# Patient Record
Sex: Male | Born: 1975 | Race: Black or African American | Hispanic: No | Marital: Married | Smoking: Heavy tobacco smoker
Health system: Southern US, Community
[De-identification: ages and names within clinical notes are randomized; demographics above are authoritative.]

## PROBLEM LIST (undated history)

## (undated) DIAGNOSIS — E119 Type 2 diabetes mellitus without complications: Secondary | ICD-10-CM

## (undated) DIAGNOSIS — J449 Chronic obstructive pulmonary disease, unspecified: Secondary | ICD-10-CM

## (undated) DIAGNOSIS — I10 Essential (primary) hypertension: Secondary | ICD-10-CM

## (undated) DIAGNOSIS — M4712 Other spondylosis with myelopathy, cervical region: Secondary | ICD-10-CM

## (undated) DIAGNOSIS — G4730 Sleep apnea, unspecified: Secondary | ICD-10-CM

## (undated) HISTORY — DX: Essential (primary) hypertension: I10

## (undated) HISTORY — PX: OTHER SURGICAL HISTORY: SHX169

## (undated) HISTORY — PX: KNEE SURGERY: SHX244

---

## 2004-12-08 ENCOUNTER — Emergency Department: Payer: Self-pay | Admitting: Emergency Medicine

## 2008-04-29 ENCOUNTER — Emergency Department: Payer: Self-pay | Admitting: Emergency Medicine

## 2008-12-04 ENCOUNTER — Emergency Department: Payer: Self-pay | Admitting: Emergency Medicine

## 2008-12-17 ENCOUNTER — Ambulatory Visit: Payer: Self-pay | Admitting: Internal Medicine
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
## 2014-08-10 ENCOUNTER — Encounter: Payer: Self-pay | Admitting: Internal Medicine

## 2014-08-10 ENCOUNTER — Ambulatory Visit: Payer: Self-pay | Attending: Internal Medicine | Admitting: Internal Medicine

## 2014-08-10 VITALS — BP 146/99 | HR 101 | Temp 98.0°F | Resp 16 | Ht 65.5 in | Wt 229.0 lb

## 2014-08-10 DIAGNOSIS — Z2821 Immunization not carried out because of patient refusal: Secondary | ICD-10-CM | POA: Insufficient documentation

## 2014-08-10 DIAGNOSIS — F17200 Nicotine dependence, unspecified, uncomplicated: Secondary | ICD-10-CM

## 2014-08-10 DIAGNOSIS — I10 Essential (primary) hypertension: Secondary | ICD-10-CM | POA: Insufficient documentation

## 2014-08-10 DIAGNOSIS — Z720 Tobacco use: Secondary | ICD-10-CM

## 2014-08-10 DIAGNOSIS — J45901 Unspecified asthma with (acute) exacerbation: Secondary | ICD-10-CM | POA: Insufficient documentation

## 2014-08-10 DIAGNOSIS — J4521 Mild intermittent asthma with (acute) exacerbation: Secondary | ICD-10-CM

## 2014-08-10 DIAGNOSIS — F17210 Nicotine dependence, cigarettes, uncomplicated: Secondary | ICD-10-CM | POA: Insufficient documentation

## 2014-08-10 MED ORDER — ALBUTEROL SULFATE (2.5 MG/3ML) 0.083% IN NEBU: 2.5000 mg | INHALATION_SOLUTION | Freq: Four times a day (QID) | RESPIRATORY_TRACT | Status: DC | PRN

## 2014-08-10 MED ORDER — LISINOPRIL 20 MG PO TABS: 20.0000 mg | ORAL_TABLET | Freq: Every day | ORAL | Status: DC

## 2014-08-10 MED ORDER — ALBUTEROL SULFATE HFA 108 (90 BASE) MCG/ACT IN AERS: 2.0000 | INHALATION_SPRAY | Freq: Four times a day (QID) | RESPIRATORY_TRACT | Status: DC | PRN

## 2014-08-10 NOTE — Patient Instructions (Signed)
Smoking Cessation Quitting smoking is important to your health and has many advantages. However, it is not always easy to quit since nicotine is a very addictive drug. Oftentimes, people try 3 times or more before being able to quit. This document explains the best ways for you to prepare to quit smoking. Quitting takes hard work and a lot of effort, but you can do it. ADVANTAGES OF QUITTING SMOKING  You will live longer, feel better, and live better.  Your body will feel the impact of quitting smoking almost immediately.  Within 20 minutes, blood pressure decreases. Your pulse returns to its normal level.  After 8 hours, carbon monoxide levels in the blood return to normal. Your oxygen level increases.  After 24 hours, the chance of having a heart attack starts to decrease. Your breath, hair, and body stop smelling like smoke.  After 48 hours, damaged nerve endings begin to recover. Your sense of taste and smell improve.  After 72 hours, the body is virtually free of nicotine. Your bronchial tubes relax and breathing becomes easier.  After 2 to 12 weeks, lungs can hold more air. Exercise becomes easier and circulation improves.  The risk of having a heart attack, stroke, cancer, or lung disease is greatly reduced.  After 1 year, the risk of coronary heart disease is cut in half.  After 5 years, the risk of stroke falls to the same as a nonsmoker.  After 10 years, the risk of lung cancer is cut in half and the risk of other cancers decreases significantly.  After 15 years, the risk of coronary heart disease drops, usually to the level of a nonsmoker.  If you are pregnant, quitting smoking will improve your chances of having a healthy baby.  The people you live with, especially any children, will be healthier.  You will have extra money to spend on things other than cigarettes. QUESTIONS TO THINK ABOUT BEFORE ATTEMPTING TO QUIT You may want to talk about your answers with your  health care provider.  Why do you want to quit?  If you tried to quit in the past, what helped and what did not?  What will be the most difficult situations for you after you quit? How will you plan to handle them?  Who can help you through the tough times? Your family? Friends? A health care provider?  What pleasures do you get from smoking? What ways can you still get pleasure if you quit? Here are some questions to ask your health care provider:  How can you help me to be successful at quitting?  What medicine do you think would be best for me and how should I take it?  What should I do if I need more help?  What is smoking withdrawal like? How can I get information on withdrawal? GET READY  Set a quit date.  Change your environment by getting rid of all cigarettes, ashtrays, matches, and lighters in your home, car, or work. Do not let people smoke in your home.  Review your past attempts to quit. Think about what worked and what did not. GET SUPPORT AND ENCOURAGEMENT You have a better chance of being successful if you have help. You can get support in many ways.  Tell your family, friends, and coworkers that you are going to quit and need their support. Ask them not to smoke around you.  Get individual, group, or telephone counseling and support. Programs are available at local hospitals and health centers. Call   your local health department for information about programs in your area.  Spiritual beliefs and practices may help some smokers quit.  Download a "quit meter" on your computer to keep track of quit statistics, such as how long you have gone without smoking, cigarettes not smoked, and money saved.  Get a self-help book about quitting smoking and staying off tobacco. LEARN NEW SKILLS AND BEHAVIORS  Distract yourself from urges to smoke. Talk to someone, go for a walk, or occupy your time with a task.  Change your normal routine. Take a different route to work.  Drink tea instead of coffee. Eat breakfast in a different place.  Reduce your stress. Take a hot bath, exercise, or read a book.  Plan something enjoyable to do every day. Reward yourself for not smoking.  Explore interactive web-based programs that specialize in helping you quit. GET MEDICINE AND USE IT CORRECTLY Medicines can help you stop smoking and decrease the urge to smoke. Combining medicine with the above behavioral methods and support can greatly increase your chances of successfully quitting smoking.  Nicotine replacement therapy helps deliver nicotine to your body without the negative effects and risks of smoking. Nicotine replacement therapy includes nicotine gum, lozenges, inhalers, nasal sprays, and skin patches. Some may be available over-the-counter and others require a prescription.  Antidepressant medicine helps people abstain from smoking, but how this works is unknown. This medicine is available by prescription.  Nicotinic receptor partial agonist medicine simulates the effect of nicotine in your brain. This medicine is available by prescription. Ask your health care provider for advice about which medicines to use and how to use them based on your health history. Your health care provider will tell you what side effects to look out for if you choose to be on a medicine or therapy. Carefully read the information on the package. Do not use any other product containing nicotine while using a nicotine replacement product.  RELAPSE OR DIFFICULT SITUATIONS Most relapses occur within the first 3 months after quitting. Do not be discouraged if you start smoking again. Remember, most people try several times before finally quitting. You may have symptoms of withdrawal because your body is used to nicotine. You may crave cigarettes, be irritable, feel very hungry, cough often, get headaches, or have difficulty concentrating. The withdrawal symptoms are only temporary. They are strongest  when you first quit, but they will go away within 10-14 days. To reduce the chances of relapse, try to:  Avoid drinking alcohol. Drinking lowers your chances of successfully quitting.  Reduce the amount of caffeine you consume. Once you quit smoking, the amount of caffeine in your body increases and can give you symptoms, such as a rapid heartbeat, sweating, and anxiety.  Avoid smokers because they can make you want to smoke.  Do not let weight gain distract you. Many smokers will gain weight when they quit, usually less than 10 pounds. Eat a healthy diet and stay active. You can always lose the weight gained after you quit.  Find ways to improve your mood other than smoking. FOR MORE INFORMATION  www.smokefree.gov  Document Released: 08/18/2001 Document Revised: 01/08/2014 Document Reviewed: 12/03/2011 [employer] Patient Information 2015 [employer]. This information is not intended to replace advice given to you by your health care provider. Make sure you discuss any questions you have with your health care provider.  

## 2014-08-10 NOTE — Progress Notes (Signed)
Patient ID: Keith Liu, male   DOB: 25-May-1976, 38 y.o.   MRN: [id number]  NWG:[id number]  [id number]  DOB - 25-May-1976  CC:  Chief Complaint  Patient presents with  . Establish Care       HPI: Keith Liu is a 38 y.o. male here today to establish medical care.  He has a past medical history of hypertension, smoking, and asthma.  He reports that he has been out of all his medications for one week.  He was on Lisinopril 20 mg and some other medication that he does not remember the name of.  He knows that the second pill was a a diuretic. He was recently released from prison 20 days ago and has noticed a 10 pound weight gain since then.    Has had asthma since childhood.  He reports that he only has flairs when he gets a cold. He states that he has difficulty breathing in his sleep and has severe snoring.  He believes he had a sleep study in the past in [place] but thinks that he used a fake name to get it. He has not used a albuterol inhaler in a very long time.  He does have a nebulizer machine.     Patient has No headache, No chest pain, No abdominal pain - No Nausea, No new weakness tingling or numbness, No Cough - SOB.  Allergies not on file Past Medical History  Diagnosis Date  . Hypertension    No current outpatient prescriptions on file prior to visit.   No current facility-administered medications on file prior to visit.   Family History  Problem Relation Age of Onset  . COPD Mother   . Diabetes Mother   . Hypertension Mother    History   Social History  . Marital Status: Single    Spouse Name: N/A    Number of Children: N/A  . Years of Education: N/A   Occupational History  . Not on file.   Social History Main Topics  . Smoking status: Heavy Tobacco Smoker  . Smokeless tobacco: Not on file  . Alcohol Use: No  . Drug Use: No  . Sexual Activity: Yes   Other Topics Concern  . Not on file   Social History Narrative  . No narrative on file     Review of Systems: Constitutional: Negative for fever, chills, diaphoresis, activity change, appetite change and fatigue. HENT: Negative for ear pain, nosebleeds, congestion, facial swelling, rhinorrhea, neck pain, neck stiffness and ear discharge.  Eyes: Negative for pain, discharge, redness, itching and visual disturbance. Respiratory: Negative for cough, choking, chest tightness, shortness of breath, wheezing and stridor.  Cardiovascular: Negative for chest pain, palpitations and leg swelling. Gastrointestinal: Negative for abdominal distention. Genitourinary: Negative for dysuria, urgency, frequency, hematuria, flank pain, decreased urine volume, difficulty urinating and dyspareunia.  Musculoskeletal: Negative for back pain, joint swelling, arthralgia and gait problem. Neurological: Negative for dizziness, tremors, seizures, syncope, facial asymmetry, speech difficulty, weakness, light-headedness, numbness and headaches.  Hematological: Negative for adenopathy. Does not bruise/bleed easily. Psychiatric/Behavioral: Negative for hallucinations, behavioral problems, confusion, dysphoric mood, decreased concentration and agitation.    Objective:   Filed Vitals:   08/10/14 1627  BP: 146/99  Pulse: 101  Temp: 98 F (36.7 C)  Resp: 16    Physical Exam: Constitutional: Patient appears well-developed and well-nourished. No distress. HENT: Normocephalic, atraumatic, External right and left ear normal. Oropharynx is clear and moist.  Eyes: Conjunctivae and EOM are normal. PERRLA, no  scleral icterus. Neck: Normal ROM. Neck supple. No JVD. No tracheal deviation. No thyromegaly. CVS: RRR, S1/S2 +, no murmurs, no gallops, no carotid bruit.  Pulmonary: Effort and breath sounds normal, no stridor, rhonchi, wheezes, rales.  Abdominal: Soft. BS +, no distension, tenderness, rebound or guarding.  Musculoskeletal: Normal range of motion. No edema and no tenderness.  Lymphadenopathy: No  lymphadenopathy noted, cervical Neuro: Alert. Normal reflexes, muscle tone coordination. No cranial nerve deficit. Skin: Skin is warm and dry. No rash noted. Not diaphoretic. No erythema. No pallor. Psychiatric: Normal mood and affect. Behavior, judgment, thought content normal.  No results found for: WBC, HGB, HCT, MCV, PLT No results found for: CREATININE, BUN, NA, K, CL, CO2  No results found for: HGBA1C Lipid Panel  No results found for: CHOL, TRIG, HDL, CHOLHDL, VLDL, LDLCALC     Assessment and plan:   Keith Liu was seen today for establish care.  Diagnoses and associated orders for this visit:  Essential hypertension - Begin lisinopril (PRINIVIL,ZESTRIL) 20 MG tablet; Take 1 tablet (20 mg total) by mouth daily. - Lipid panel; Future - CBC; Future - COMPLETE METABOLIC PANEL WITH GFR; Future - Hemoglobin A1C; Future Patient blood pressure remains elevated today, will increase BP medication and have patient to return in 2 weeks for blood pressure recheck with nurse. Stressed diet changes, regular exercise regimen, and modifiable risk factors. Will follow up with CMP as needed, Will follow up with patient in 3-6 months.   Asthma with exacerbation, mild intermittent - albuterol (PROVENTIL HFA;VENTOLIN HFA) 108 (90 BASE) MCG/ACT inhaler; Inhale 2 puffs into the lungs every 6 (six) hours as needed for wheezing or shortness of breath. - albuterol (PROVENTIL) (2.5 MG/3ML) 0.083% nebulizer solution; Take 3 mLs (2.5 mg total) by nebulization every 6 (six) hours as needed for wheezing or shortness of breath. Smoking cessation discussed  Smoking Smoking cessation discussed for 3 minutes, patient is not willing to quit at this time. Will continue to assess on each visit. Discussed increased risk for diseases such as cancer, heart disease, and stroke.   Refused influenza vaccine Explained that annual influenza is recommended per CDC guidelines and is highly suggested to anyone who has has  CHF, COPD, DM or immunocompromised. Benefits of influenza described in detail.   Return in about 2 weeks (around 08/24/2014) for Nurse Visit-BP check and Lab and 3 mo PCP.Marland Kitchen.  The patient was given clear instructions to go to ER or return to medical center if symptoms don't improve, worsen or new problems develop. The patient verbalized understanding. The patient was told to call to get lab results if they haven't heard anything in the next week.     Holland CommonsKECK, Dauntae Derusha, NP-C [facility] [phone] 08/10/2014, 4:42 PM

## 2014-08-10 NOTE — Progress Notes (Signed)
Pt is here to establish care. Pt reports that he has SOB, asthma and a history of HTN. Pt has been out of medications for a week. Pt has a headache right now.

## 2014-08-22 ENCOUNTER — Encounter (HOSPITAL_COMMUNITY): Payer: Self-pay | Admitting: Family Medicine

## 2014-08-22 ENCOUNTER — Emergency Department (INDEPENDENT_AMBULATORY_CARE_PROVIDER_SITE_OTHER)
Admission: EM | Admit: 2014-08-22 | Discharge: 2014-08-22 | Disposition: A | Discharge status: Nursing Home (Includes ICF) | Payer: Self-pay | Source: Home / Self Care | Attending: Emergency Medicine | Admitting: Emergency Medicine

## 2014-08-22 ENCOUNTER — Encounter (HOSPITAL_COMMUNITY): Payer: Self-pay | Admitting: *Deleted

## 2014-08-22 ENCOUNTER — Emergency Department (HOSPITAL_COMMUNITY)
Admission: EM | Admit: 2014-08-22 | Discharge: 2014-08-22 | Disposition: A | Discharge status: Home / Self Care | Payer: Self-pay | Attending: Emergency Medicine | Admitting: Emergency Medicine

## 2014-08-22 ENCOUNTER — Emergency Department (HOSPITAL_COMMUNITY): Payer: Self-pay

## 2014-08-22 DIAGNOSIS — R109 Unspecified abdominal pain: Secondary | ICD-10-CM | POA: Insufficient documentation

## 2014-08-22 DIAGNOSIS — I10 Essential (primary) hypertension: Secondary | ICD-10-CM | POA: Insufficient documentation

## 2014-08-22 DIAGNOSIS — R630 Anorexia: Secondary | ICD-10-CM | POA: Insufficient documentation

## 2014-08-22 DIAGNOSIS — Z720 Tobacco use: Secondary | ICD-10-CM | POA: Insufficient documentation

## 2014-08-22 DIAGNOSIS — T783XXA Angioneurotic edema, initial encounter: Secondary | ICD-10-CM

## 2014-08-22 DIAGNOSIS — R1032 Left lower quadrant pain: Secondary | ICD-10-CM

## 2014-08-22 LAB — URINALYSIS, ROUTINE W REFLEX MICROSCOPIC
BILIRUBIN URINE: NEGATIVE
Glucose, UA: 100 mg/dL — AB
HGB URINE DIPSTICK: NEGATIVE
Ketones, ur: NEGATIVE mg/dL
Leukocytes, UA: NEGATIVE
Nitrite: NEGATIVE
PROTEIN: NEGATIVE mg/dL
Specific Gravity, Urine: 1.029 (ref 1.005–1.030)
UROBILINOGEN UA: 1 mg/dL (ref 0.0–1.0)
pH: 6 (ref 5.0–8.0)

## 2014-08-22 MED ORDER — HYDROCODONE-ACETAMINOPHEN 5-325 MG PO TABS
1.0000 | ORAL_TABLET | Freq: Once | ORAL | Status: AC
  Administered 2014-08-22: 1 via ORAL
  Filled 2014-08-22: qty 1

## 2014-08-22 MED ORDER — KETOROLAC TROMETHAMINE 60 MG/2ML IM SOLN
60.0000 mg | Freq: Once | INTRAMUSCULAR | Status: AC
  Administered 2014-08-22: 60 mg via INTRAMUSCULAR
  Filled 2014-08-22: qty 2

## 2014-08-22 MED ORDER — AMLODIPINE BESYLATE 5 MG PO TABS: 5.0000 mg | ORAL_TABLET | Freq: Every day | ORAL | Status: DC

## 2014-08-22 NOTE — ED Notes (Signed)
Pt  Reports  abd pain  l  Side         With   abd  Swollen     He  Reports  The  Symptoms  X  3  Days        -    He  denys  Any  Nausea  Vomiting  /   Diarrhea       -  He  Reports  The  Pain is  intermittant          -        he also  Reports  His         Lip  Is     Swollen       He  Takes  Lisinopril

## 2014-08-22 NOTE — ED Provider Notes (Signed)
CSN: [id number]     Arrival date & time 08/22/14  [id number] History   First MD Initiated Contact with Patient 08/22/14 [phone]     Chief Complaint  Patient presents with  . Abdominal Pain   (Consider location/radiation/quality/duration/timing/severity/associated sxs/prior Treatment) HPI Comments: Patient also mentions that when he woke this morning, his lower lip was swollen. States this has occurred on one occasion in the past, about 5-6 mos ago while he was incarcerated. Takes lisinopril daily. States swelling has already begun to improve. Denies pain, fever, swelling of tongue or throat. No difficulty breathing, speaking or swallowing.  PCP: [facility]  Patient is a 38 y.o. male presenting with abdominal pain. The history is provided by the patient.  Abdominal Pain Pain location:  LLQ Pain quality: aching   Pain severity:  Moderate Onset quality:  Gradual Duration:  3 days Progression:  Waxing and waning Chronicity:  New Context comment:  Last normal BM: this morning. Relieved by:  Lying down Exacerbated by: nothing in particular. Ineffective treatments:  None tried Associated symptoms: no anorexia, no chest pain, no constipation, no cough, no diarrhea, no dysuria, no fever, no hematemesis, no hematochezia, no hematuria, no melena, no nausea, no shortness of breath, no sore throat and no vomiting   Risk factors: obesity   Risk factors comment:  No previous abdominal surgeries   Past Medical History  Diagnosis Date  . Hypertension    Past Surgical History  Procedure Laterality Date  . Knee surgery Left    Family History  Problem Relation Age of Onset  . COPD Mother   . Diabetes Mother   . Hypertension Mother    History  Substance Use Topics  . Smoking status: Heavy Tobacco Smoker  . Smokeless tobacco: Not on file  . Alcohol Use: 0.0 oz/week    0 Not specified per week    Review of Systems  Constitutional: Negative for fever.  HENT: Negative for sore throat.   Respiratory:  Negative for cough and shortness of breath.   Cardiovascular: Negative for chest pain.  Gastrointestinal: Positive for abdominal pain. Negative for nausea, vomiting, diarrhea, constipation, melena, hematochezia, anorexia and hematemesis.  Genitourinary: Negative for dysuria and hematuria.    Allergies  Lisinopril  Home Medications   Prior to Admission medications   Medication Sig Start Date End Date Taking? Authorizing Provider  albuterol (PROVENTIL HFA;VENTOLIN HFA) 108 (90 BASE) MCG/ACT inhaler Inhale 2 puffs into the lungs every 6 (six) hours as needed for wheezing or shortness of breath. 08/10/14   Ambrose FinlandValerie A Keck, NP  albuterol (PROVENTIL) (2.5 MG/3ML) 0.083% nebulizer solution Take 3 mLs (2.5 mg total) by nebulization every 6 (six) hours as needed for wheezing or shortness of breath. 08/10/14   Ambrose FinlandValerie A Keck, NP  lisinopril (PRINIVIL,ZESTRIL) 20 MG tablet Take 1 tablet (20 mg total) by mouth daily. 08/10/14   Ambrose FinlandValerie A Keck, NP   BP 140/89 mmHg  Pulse 80  Temp(Src) 97.8 F (36.6 C) (Oral)  Resp 16  SpO2 100% Physical Exam  Constitutional: He is oriented to person, place, and time. He appears well-developed and well-nourished.  HENT:  Head: Normocephalic and atraumatic.  Nose: Nose normal.  Mouth/Throat: Uvula is midline, oropharynx is clear and moist and mucous membranes are normal. No oral lesions. No trismus in the jaw. No uvula swelling.  +mild swelling of lower lip. Tongue and oropharynx normal.   Eyes: Conjunctivae are normal. No scleral icterus.  Neck: Normal range of motion. Neck supple.  Cardiovascular: Normal rate,  regular rhythm and normal heart sounds.   Pulmonary/Chest: Effort normal and breath sounds normal. No stridor. No respiratory distress. He has no wheezes.  Abdominal: Soft. Bowel sounds are normal. He exhibits distension. There is tenderness. There is no rigidity, no rebound, no guarding and no CVA tenderness.    Outlined area is region of discomfort with  palpation and palpation of other regions of abdomen cause referred pain to this region.  Exam somewhat limited by obesity  Musculoskeletal: Normal range of motion.  Neurological: He is alert and oriented to person, place, and time.  Skin: Skin is warm and dry. No rash noted. No erythema.  Psychiatric: He has a normal mood and affect. His behavior is normal.  Nursing note and vitals reviewed.   ED Course  Procedures (including critical care time) Labs Review Labs Reviewed - No data to display  Imaging Review No results found.   MDM   1. Left lower quadrant pain   2. Angioedema, initial encounter   Patient advised to discontinue lisinopril and begin taking Norvasc as prescribed. Advised to notify his PCP regarding change and arrange follow up evaluation. With regard to abdominal discomfort, patient advised to [facility] or [facility] for further evaluation as [facility] cannot currently provide imaging necessary for evaluation of this complaint. Patient stable at time of discharge and in agreement with recommended instructions. Advised to remain NPO until evaluation completed.    Ria ClockJennifer Lee H Jeryl Wilbourn, PA 08/22/14 1000

## 2014-08-22 NOTE — ED Notes (Signed)
Family at bedside. 

## 2014-08-22 NOTE — ED Notes (Addendum)
[facility] and [facility]. sts lip started this am. [facility]. Denies N,V,D. Airway intact and no trouble breathing.

## 2014-08-22 NOTE — ED Notes (Signed)
Patient transported to CT 

## 2014-08-22 NOTE — ED Provider Notes (Signed)
CSN: [id number]     Arrival date & time 08/22/14  1007 History   First MD Initiated Contact with Patient 08/22/14 1111     Chief Complaint  Patient presents with  . Flank Pain     (Consider location/radiation/quality/duration/timing/severity/associated sxs/prior Treatment) HPI Comments: 38 year old male with history of high blood pressure sleep apnea, lisinopril allergy/sensitivity presents with recurrent left flank pain for the past 3-4 days. Patient recently had diagnosis of lisinopril sensitivity due to  lip swelling which resolved and patient is currently not taking. Patient has had nonspecific left flank pain lasting seconds to minutes at a time severe at times, no history of this or kidney stones. No urinary symptoms. No abdominal surgeries or anterior abdominal pain. Not associated with thinning specific specially not eating.  Patient is a 38 y.o. male presenting with flank pain. The history is provided by the patient.  Flank Pain Pertinent negatives include no chest pain, no abdominal pain, no headaches and no shortness of breath.    Past Medical History  Diagnosis Date  . Hypertension    Past Surgical History  Procedure Laterality Date  . Knee surgery Left    Family History  Problem Relation Age of Onset  . COPD Mother   . Diabetes Mother   . Hypertension Mother    History  Substance Use Topics  . Smoking status: Heavy Tobacco Smoker  . Smokeless tobacco: Not on file  . Alcohol Use: 0.0 oz/week    0 Not specified per week    Review of Systems  Constitutional: Positive for appetite change. Negative for fever and chills.  HENT: Negative for congestion.   Eyes: Negative for visual disturbance.  Respiratory: Negative for shortness of breath.   Cardiovascular: Negative for chest pain.  Gastrointestinal: Negative for vomiting and abdominal pain.  Genitourinary: Positive for flank pain. Negative for dysuria.  Musculoskeletal: Negative for back pain, neck pain and  neck stiffness.  Skin: Negative for rash.  Neurological: Negative for light-headedness and headaches.      Allergies  Lisinopril  Home Medications   Prior to Admission medications   Medication Sig Start Date End Date Taking? Authorizing Provider  albuterol (PROVENTIL HFA;VENTOLIN HFA) 108 (90 BASE) MCG/ACT inhaler Inhale 2 puffs into the lungs every 6 (six) hours as needed for wheezing or shortness of breath. 08/10/14  Yes Ambrose FinlandValerie A Keck, NP  albuterol (PROVENTIL) (2.5 MG/3ML) 0.083% nebulizer solution Take 3 mLs (2.5 mg total) by nebulization every 6 (six) hours as needed for wheezing or shortness of breath. 08/10/14  Yes Ambrose FinlandValerie A Keck, NP  ibuprofen (ADVIL,MOTRIN) 800 MG tablet Take 1,600 mg by mouth every 8 (eight) hours as needed for mild pain.   Yes Historical Provider, MD  amLODipine (NORVASC) 5 MG tablet Take 1 tablet (5 mg total) by mouth daily. 08/22/14   Jess BartersJennifer Lee H Presson, PA   BP 133/96 mmHg  Pulse 81  Temp(Src) 97.5 F (36.4 C) (Oral)  Resp 17  Ht 5\' 6"  (1.676 m)  Wt 230 lb (104.327 kg)  BMI 37.14 kg/m2  SpO2 99% Physical Exam  Constitutional: He is oriented to person, place, and time. He appears well-developed and well-nourished.  HENT:  Head: Normocephalic and atraumatic.  Eyes: Conjunctivae are normal. Right eye exhibits no discharge. Left eye exhibits no discharge.  Neck: Normal range of motion. Neck supple. No tracheal deviation present.  Cardiovascular: Normal rate and regular rhythm.   Pulmonary/Chest: Effort normal and breath sounds normal.  Abdominal: Soft. He exhibits no distension.  There is no tenderness. There is no guarding.  Musculoskeletal: He exhibits tenderness (left flank mild). He exhibits no edema.  Neurological: He is alert and oriented to person, place, and time.  Skin: Skin is warm. No rash noted.  Psychiatric: He has a normal mood and affect.  Nursing note and vitals reviewed.   ED Course  Procedures (including critical care  time) Emergency Focused Ultrasound Exam Limited retroperitoneal ultrasound of kidneys  Performed and interpreted by Dr. Jodi MourningZavitz Indication: flank pain Focused abdominal ultrasound with both kidneys imaged in transverse and longitudinal planes in real-time. Interpretation: no hydronephrosis visualized.   Images archived electronically  Labs Review Labs Reviewed  URINALYSIS, ROUTINE W REFLEX MICROSCOPIC - Abnormal; Notable for the following:    Glucose, UA 100 (*)    All other components within normal limits    Imaging Review Ct Renal Stone Study  08/22/2014   CLINICAL DATA:  Left flank pain  EXAM: CT ABDOMEN AND PELVIS WITHOUT CONTRAST  TECHNIQUE: Multidetector CT imaging of the abdomen and pelvis was performed following the standard protocol without IV contrast.  COMPARISON:  None.  FINDINGS: Lung bases are clear.  Heart size is mildly enlarged.  Hepatomegaly with fatty infiltration of the liver. No focal liver mass. Gallbladder and bile ducts normal. Pancreas and spleen normal  Kidneys are normal bilaterally. No renal obstruction or mass. No urinary tract calculi. Urinary bladder normal. Prostate normal in size.  Negative for bowel obstruction or bowel thickening. Normal appendix. Negative for diverticulitis.  Negative for free fluid. Negative for mass or adenopathy. No spinal abnormality.  IMPRESSION: Negative for urinary tract abnormality.  No renal calculi  Hepatomegaly with fatty infiltration of the liver.  Mild cardiac enlargement.   Electronically Signed   By: Marlan Palauharles  Clark M.D.   On: 08/22/2014 13:30     EKG Interpretation None      MDM   Final diagnoses:  Acute left flank pain   Well-appearing male with left flank pain. Discussed differential diagnosis including kidney stone. Bedside ultrasound no significant hydronephrosis, urinalysis overall unremarkable. Discussed pros and cons of CT scan, I do not feel CT scan will change management. Patient would really like to confirm  if this is his first kidney stone. I discussed radiation risk, CT scan pending.  Pain improved in the ER, pain meds given. CT scan results reviewed no acute findings. Outpatient follow-up.  Results and differential diagnosis were discussed with the patient/parent/guardian. Close follow up outpatient was discussed, comfortable with the plan.   Medications  HYDROcodone-acetaminophen (NORCO/VICODIN) 5-325 MG per tablet 1 tablet (1 tablet Oral Given 08/22/14 1309)  ketorolac (TORADOL) injection 60 mg (60 mg Intramuscular Given 08/22/14 1309)    Filed Vitals:   08/22/14 1200 08/22/14 1215 08/22/14 1230 08/22/14 1245  BP: 141/92 112/94 138/95 133/96  Pulse: 90 80 80 81  Temp:      TempSrc:      Resp:      Height:      Weight:      SpO2: 100% 99% 97% 99%    Final diagnoses:  Acute left flank pain      Enid SkeensJoshua M Lavren Lewan, MD 08/22/14 1337

## 2014-08-22 NOTE — Discharge Instructions (Signed)
You will need to stop taking your Lisinopril and begin taking Norvasc as prescribed. Please notify your primary care doctor of these changes and arrange follow up evaluation to ensure that Norvasc (new medication) is controlling your blood pressure. With regard to your abdominal discomfort, I would strongly advise you to seek additional evaluation [facility] or [facility] upon leaving here. We are not equipped [facility] at [facility] to perform the necessary xray studies you will likely need to determine the origin of your discomfort.  Please do not eat or drink anything upon leaving here en route to the ER.   Abdominal Pain Many things can cause abdominal pain. Usually, abdominal pain is not caused by a disease and will improve without treatment. It can often be observed and treated at home. Your health care provider will do a physical exam and possibly order blood tests and X-rays to help determine the seriousness of your pain. However, in many cases, more time must pass before a clear cause of the pain can be found. Before that point, your health care provider may not know if you need more testing or further treatment. HOME CARE INSTRUCTIONS  Monitor your abdominal pain for any changes. The following actions may help to alleviate any discomfort you are experiencing:  Only take over-the-counter or prescription medicines as directed by your health care provider.  Do not take laxatives unless directed to do so by your health care provider.  Try a clear liquid diet (broth, tea, or water) as directed by your health care provider. Slowly move to a bland diet as tolerated. SEEK MEDICAL CARE IF:  You have unexplained abdominal pain.  You have abdominal pain associated with nausea or diarrhea.  You have pain when you urinate or have a bowel movement.  You experience abdominal pain that wakes you in the night.  You have abdominal pain that is worsened or improved by eating food.  You have  abdominal pain that is worsened with eating fatty foods.  You have a fever. SEEK IMMEDIATE MEDICAL CARE IF:   Your pain does not go away within 2 hours.  You keep throwing up (vomiting).  Your pain is felt only in portions of the abdomen, such as the right side or the left lower portion of the abdomen.  You pass bloody or black tarry stools. MAKE SURE YOU:  Understand these instructions.   Will watch your condition.   Will get help right away if you are not doing well or get worse.  Document Released: 06/03/2005 Document Revised: 08/29/2013 Document Reviewed: 05/03/2013 [facility] Patient Information 2015 [place], [place]. This information is not intended to replace advice given to you by your health care provider. Make sure you discuss any questions you have with your health care provider.  Angioedema Angioedema is a sudden swelling of tissues, often of the skin. It can occur on the face or genitals or in the abdomen or other body parts. The swelling usually develops over a short period and gets better in 24 to 48 hours. It often begins during the night and is found when the person wakes up. The person may also get red, itchy patches of skin (hives). Angioedema can be dangerous if it involves swelling of the air passages.  Depending on the cause, episodes of angioedema may only happen once, come back in unpredictable patterns, or repeat for several years and then gradually fade away.  CAUSES  Angioedema can be caused by an allergic reaction to various  triggers. It can also result from nonallergic causes, including reactions to drugs, immune system disorders, viral infections, or an abnormal gene that is passed to you from your parents (hereditary). For some people with angioedema, the cause is unknown.  Some things that can trigger angioedema include:   Foods.   Medicines, such as ACE inhibitors, ARBs, nonsteroidal anti-inflammatory agents, or estrogen.   Latex.   Animal  saliva.   Insect stings.   Dyes used in X-rays.   Mild injury.   Dental work.  Surgery.  Stress.   Sudden changes in temperature.   Exercise. SIGNS AND SYMPTOMS   Swelling of the skin.  Hives. If these are present, there is also intense itching.  Redness in the affected area.   Pain in the affected area.  Swollen lips or tongue.  Breathing problems. This may happen if the air passages swell.  Wheezing. If internal organs are involved, there may be:   Nausea.   Abdominal pain.   Vomiting.   Difficulty swallowing.   Difficulty passing urine. DIAGNOSIS   Your health care provider will examine the affected area and take a medical and family history.  Various tests may be done to help determine the cause. Tests may include:  Allergy skin tests to see if the problem is an allergic reaction.   Blood tests to check for hereditary angioedema.   Tests to check for underlying diseases that could cause the condition.   A review of your medicines, including over-the-counter medicines, may be done. TREATMENT  Treatment will depend on the cause of the angioedema. Possible treatments include:   Removal of anything that triggered the condition (such as stopping certain medicines).   Medicines to treat symptoms or prevent attacks. Medicines given may include:   Antihistamines.   Epinephrine injection.   Steroids.   Hospitalization may be required for severe attacks. If the air passages are affected, it can be an emergency. Tubes may need to be placed to keep the airway open. HOME CARE INSTRUCTIONS   Take all medicines as directed by your health care provider.  If you were given medicines for emergency allergy treatment, always carry them with you.  Wear a medical bracelet as directed by your health care provider.   Avoid known triggers. SEEK MEDICAL CARE IF:   You have repeat attacks of angioedema.   Your attacks are more frequent  or more severe despite preventive measures.   You have hereditary angioedema and are considering having children. It is important to discuss with your health care provider the risks of passing the condition on to your children. SEEK IMMEDIATE MEDICAL CARE IF:   You have severe swelling of the mouth, tongue, or lips.  You have difficulty breathing.   You have difficulty swallowing.   You faint. MAKE SURE YOU:  Understand these instructions.  Will watch your condition.  Will get help right away if you are not doing well or get worse. Document Released: 11/02/2001 Document Revised: 01/08/2014 Document Reviewed: 04/17/2013 [facility] Patient Information 2015 [place], [place]. This information is not intended to replace advice given to you by your health care provider. Make sure you discuss any questions you have with your health care provider.

## 2014-08-22 NOTE — Discharge Instructions (Signed)
If you were given medicines take as directed.  If you are on coumadin or contraceptives realize their levels and effectiveness is altered by many different medicines.  If you have any reaction (rash, tongues swelling, other) to the medicines stop taking and see a physician.   Please follow up as directed and return to the ER or see a physician for new or worsening symptoms.  Thank you. Filed Vitals:   08/22/14 1200 08/22/14 1215 08/22/14 1230 08/22/14 1245  BP: 141/92 112/94 138/95 133/96  Pulse: 90 80 80 81  Temp:      TempSrc:      Resp:      Height:      Weight:      SpO2: 100% 99% 97% 99%

## 2014-08-24 ENCOUNTER — Ambulatory Visit: Payer: Self-pay | Attending: Internal Medicine

## 2014-08-24 DIAGNOSIS — I10 Essential (primary) hypertension: Secondary | ICD-10-CM

## 2014-08-24 LAB — LIPID PANEL
Cholesterol: 177 mg/dL (ref 0–200)
HDL: 43 mg/dL (ref >39)
LDL CALC: 96 mg/dL (ref 0–99)
Total CHOL/HDL Ratio: 4.1 Ratio
Triglycerides: 192 mg/dL — ABNORMAL HIGH (ref <150)
VLDL: 38 mg/dL (ref 0–40)

## 2014-08-24 LAB — CBC
HEMATOCRIT: 44.9 % (ref 39.0–52.0)
HEMOGLOBIN: 14.8 g/dL (ref 13.0–17.0)
MCH: 26.6 pg (ref 26.0–34.0)
MCHC: 33 g/dL (ref 30.0–36.0)
MCV: 80.8 fL (ref 78.0–100.0)
MPV: 11.4 fL (ref 9.4–12.4)
PLATELETS: 274 10*3/uL (ref 150–400)
RBC: 5.56 MIL/uL (ref 4.22–5.81)
RDW: 15.8 % — ABNORMAL HIGH (ref 11.5–15.5)
WBC: 8.7 10*3/uL (ref 4.0–10.5)

## 2014-08-24 LAB — COMPLETE METABOLIC PANEL WITH GFR
ALBUMIN: 4 g/dL (ref 3.5–5.2)
ALK PHOS: 45 U/L (ref 39–117)
ALT: 28 U/L (ref 0–53)
AST: 18 U/L (ref 0–37)
BILIRUBIN TOTAL: 0.4 mg/dL (ref 0.2–1.2)
BUN: 9 mg/dL (ref 6–23)
CHLORIDE: 101 meq/L (ref 96–112)
CO2: 27 mEq/L (ref 19–32)
Calcium: 9.4 mg/dL (ref 8.4–10.5)
Creat: 0.84 mg/dL (ref 0.50–1.35)
GFR, Est African American: >89 mL/min
GFR, Est Non African American: >89 mL/min
Glucose, Bld: 145 mg/dL — ABNORMAL HIGH (ref 70–99)
Potassium: 4.7 mEq/L (ref 3.5–5.3)
SODIUM: 141 meq/L (ref 135–145)
TOTAL PROTEIN: 7.3 g/dL (ref 6.0–8.3)

## 2014-08-24 LAB — HEMOGLOBIN A1C
Hgb A1c MFr Bld: 8 % — ABNORMAL HIGH (ref <5.7)
Mean Plasma Glucose: 183 mg/dL — ABNORMAL HIGH (ref <117)

## 2014-09-10 ENCOUNTER — Telehealth: Payer: Self-pay | Admitting: *Deleted

## 2014-09-10 MED ORDER — METFORMIN HCL 500 MG PO TABS: 500.0000 mg | ORAL_TABLET | Freq: Two times a day (BID) | ORAL | Status: DC

## 2014-09-10 MED ORDER — ATORVASTATIN CALCIUM 10 MG PO TABS: 10.0000 mg | ORAL_TABLET | Freq: Every day | ORAL | Status: DC

## 2014-09-10 NOTE — Telephone Encounter (Signed)
Pt returned call, lab results and medication information was given Pt advised to make F/U appointment with PCP

## 2014-09-10 NOTE — Telephone Encounter (Signed)
Rx Metformin , Atorvastain  was e-scribed to [facility] pharmacy  Per Pt girlfriend best number to contact him=  [phone] Left voice message to return call

## 2014-09-10 NOTE — Telephone Encounter (Signed)
-----   Message from Keith Finland, NP sent at 09/02/2014  8:05 PM EST ----- Patient is Diabetic, please send Metformin 500 mg to take BID. Please explain that it may cause stomach discomfort for the first week but it should improve.  Please have patient to make appt with me for further education relating to diabetes.   Please also place him on atorvastatin 10 mg for protection against risk of heart disease and stroke.

## 2014-09-12 ENCOUNTER — Ambulatory Visit: Payer: Self-pay

## 2014-09-14 ENCOUNTER — Ambulatory Visit: Payer: Medicaid Other | Admitting: Internal Medicine

## 2014-09-21 ENCOUNTER — Encounter: Payer: Self-pay | Admitting: Internal Medicine

## 2014-09-21 ENCOUNTER — Ambulatory Visit: Payer: Self-pay | Attending: Internal Medicine | Admitting: Internal Medicine

## 2014-09-21 VITALS — BP 145/85 | HR 107 | Temp 98.3°F | Resp 14 | Ht 66.0 in | Wt 236.0 lb

## 2014-09-21 DIAGNOSIS — E119 Type 2 diabetes mellitus without complications: Secondary | ICD-10-CM | POA: Insufficient documentation

## 2014-09-21 DIAGNOSIS — Z720 Tobacco use: Secondary | ICD-10-CM | POA: Insufficient documentation

## 2014-09-21 DIAGNOSIS — E785 Hyperlipidemia, unspecified: Secondary | ICD-10-CM | POA: Insufficient documentation

## 2014-09-21 DIAGNOSIS — E1169 Type 2 diabetes mellitus with other specified complication: Secondary | ICD-10-CM | POA: Insufficient documentation

## 2014-09-21 DIAGNOSIS — F17200 Nicotine dependence, unspecified, uncomplicated: Secondary | ICD-10-CM | POA: Insufficient documentation

## 2014-09-21 DIAGNOSIS — B360 Pityriasis versicolor: Secondary | ICD-10-CM

## 2014-09-21 DIAGNOSIS — B353 Tinea pedis: Secondary | ICD-10-CM | POA: Insufficient documentation

## 2014-09-21 LAB — GLUCOSE, POCT (MANUAL RESULT ENTRY): POC GLUCOSE: 246 mg/dL — AB (ref 70–99)

## 2014-09-21 MED ORDER — TERBINAFINE HCL 250 MG PO TABS: 250.0000 mg | ORAL_TABLET | Freq: Every day | ORAL | Status: DC

## 2014-09-21 NOTE — Patient Instructions (Signed)
Diabetes and Standards of Medical Care Diabetes is complicated. You may find that your diabetes team includes a dietitian, nurse, diabetes educator, eye doctor, and more. To help everyone know what is going on and to help you get the care you deserve, the following schedule of care was developed to help keep you on track. Below are the tests, exams, vaccines, medicines, education, and plans you will need. HbA1c test This test shows how well you have controlled your glucose over the past 2-3 months. It is used to see if your diabetes management plan needs to be adjusted.   It is performed at least 2 times a year if you are meeting treatment goals.  It is performed 4 times a year if therapy has changed or if you are not meeting treatment goals. Blood pressure test  This test is performed at every routine medical visit. The goal is less than 140/90 mm Hg for most people, but 130/80 mm Hg in some cases. Ask your health care provider about your goal. Dental exam  Follow up with the dentist regularly. Eye exam  If you are diagnosed with type 1 diabetes as a child, get an exam upon reaching the age of 37 years or older and have had diabetes for 3-5 years. Yearly eye exams are recommended after that initial eye exam.  If you are diagnosed with type 1 diabetes as an adult, get an exam within 5 years of diagnosis and then yearly.  If you are diagnosed with type 2 diabetes, get an exam as soon as possible after the diagnosis and then yearly. Foot care exam  Visual foot exams are performed at every routine medical visit. The exams check for cuts, injuries, or other problems with the feet.  A comprehensive foot exam should be done yearly. This includes visual inspection as well as assessing foot pulses and testing for loss of sensation.  Check your feet nightly for cuts, injuries, or other problems with your feet. Tell your health care provider if anything is not healing. Kidney function test (urine  microalbumin)  This test is performed once a year.  Type 1 diabetes: The first test is performed 5 years after diagnosis.  Type 2 diabetes: The first test is performed at the time of diagnosis.  A serum creatinine and estimated glomerular filtration rate (eGFR) test is done once a year to assess the level of chronic kidney disease (CKD), if present. Lipid profile (cholesterol, HDL, LDL, triglycerides)  Performed every 5 years for most people.  The goal for LDL is less than 100 mg/dL. If you are at high risk, the goal is less than 70 mg/dL.  The goal for HDL is 40 mg/dL-50 mg/dL for men and 50 mg/dL-60 mg/dL for women. An HDL cholesterol of 60 mg/dL or higher gives some protection against heart disease.  The goal for triglycerides is less than 150 mg/dL. Influenza vaccine, pneumococcal vaccine, and hepatitis B vaccine  The influenza vaccine is recommended yearly.  It is recommended that people with diabetes who are over 24 years old get the pneumonia vaccine. In some cases, two separate shots may be given. Ask your health care provider if your pneumonia vaccination is up to date.  The hepatitis B vaccine is also recommended for adults with diabetes. Diabetes self-management education  Education is recommended at diagnosis and ongoing as needed. Treatment plan  Your treatment plan is reviewed at every medical visit. Document Released: 06/21/2009 Document Revised: 01/08/2014 Document Reviewed: 01/24/2013 [facility] Patient Information 2015 [place],  LLC. This information is not intended to replace advice given to you by your health care provider. Make sure you discuss any questions you have with your health care provider.  

## 2014-09-21 NOTE — Progress Notes (Signed)
Patient ID: Keith Liu, male   DOB: 09-28-1975, 39 y.o.   MRN: [id number]  CC: new onset DM  HPI: Keith Liu is a 39 y.o. male here today for a follow up visit.  Patient has past medical history of hypertension.  He is a new onset diabetic.  Since last visit his hemoglobin a1c came back at 8. He states that he just began taking his Metformin and Lipitor last week.  He reports some stomach discomfort with Metformin use. He reports that he has had scaly skin on his bilateral feet and itching for several months.  He plans to begin a exercise regimen.    Patient has No headache, No chest pain, No abdominal pain - No Nausea, No new weakness tingling or numbness, No Cough - SOB.  Allergies  Allergen Reactions  . Lisinopril Swelling    Angioedema of lower lip   Past Medical History  Diagnosis Date  . Hypertension    Current Outpatient Prescriptions on File Prior to Visit  Medication Sig Dispense Refill  . albuterol (PROVENTIL HFA;VENTOLIN HFA) 108 (90 BASE) MCG/ACT inhaler Inhale 2 puffs into the lungs every 6 (six) hours as needed for wheezing or shortness of breath. 1 Inhaler 3  . albuterol (PROVENTIL) (2.5 MG/3ML) 0.083% nebulizer solution Take 3 mLs (2.5 mg total) by nebulization every 6 (six) hours as needed for wheezing or shortness of breath. 150 mL 3  . amLODipine (NORVASC) 5 MG tablet Take 1 tablet (5 mg total) by mouth daily. 30 tablet 1  . atorvastatin (LIPITOR) 10 MG tablet Take 1 tablet (10 mg total) by mouth daily. 90 tablet 0  . metFORMIN (GLUCOPHAGE) 500 MG tablet Take 1 tablet (500 mg total) by mouth 2 (two) times daily with a meal. 60 tablet 3  . ibuprofen (ADVIL,MOTRIN) 800 MG tablet Take 1,600 mg by mouth every 8 (eight) hours as needed for mild pain.    . [DISCONTINUED] lisinopril (PRINIVIL,ZESTRIL) 20 MG tablet Take 1 tablet (20 mg total) by mouth daily. 30 tablet 1   No current facility-administered medications on file prior to visit.   Family History  Problem  Relation Age of Onset  . COPD Mother   . Diabetes Mother   . Hypertension Mother    History   Social History  . Marital Status: Single    Spouse Name: N/A    Number of Children: N/A  . Years of Education: N/A   Occupational History  . Not on file.   Social History Main Topics  . Smoking status: Heavy Tobacco Smoker  . Smokeless tobacco: Not on file  . Alcohol Use: 0.0 oz/week    0 Not specified per week  . Drug Use: No  . Sexual Activity: Yes   Other Topics Concern  . Not on file   Social History Narrative    Review of Systems  Constitutional: Negative for weight loss.  Eyes: Negative.   Respiratory: Negative.   Cardiovascular: Negative.   Gastrointestinal: Negative for nausea, vomiting and abdominal pain.  Genitourinary: Positive for frequency. Negative for dysuria.  Neurological: Positive for tingling (minimal in BLE). Negative for headaches.  Endo/Heme/Allergies: Positive for polydipsia.     Objective:   Filed Vitals:   09/21/14 1443  BP: 145/85  Pulse: 107  Temp: 98.3 F (36.8 C)  Resp: 14    Physical Exam  Constitutional: He is oriented to person, place, and time.  Cardiovascular: Normal rate, regular rhythm, normal heart sounds and intact distal pulses.  Pulmonary/Chest: Effort normal and breath sounds normal.  Abdominal: Soft. Bowel sounds are normal.  Neurological: He is alert and oriented to person, place, and time.  Skin: Skin is warm and dry. Rash (hypopigmented areas on face which appears to be tinea versicolor) noted.  Very dry scaly skin on bilateral feet     Lab Results  Component Value Date   WBC 8.7 08/24/2014   HGB 14.8 08/24/2014   HCT 44.9 08/24/2014   MCV 80.8 08/24/2014   PLT 274 08/24/2014   Lab Results  Component Value Date   CREATININE 0.84 08/24/2014   BUN 9 08/24/2014   NA 141 08/24/2014   K 4.7 08/24/2014   CL 101 08/24/2014   CO2 27 08/24/2014    Lab Results  Component Value Date   HGBA1C 8.0* 08/24/2014    Lipid Panel     Component Value Date/Time   CHOL 177 08/24/2014 0934   TRIG 192* 08/24/2014 0934   HDL 43 08/24/2014 0934   CHOLHDL 4.1 08/24/2014 0934   VLDL 38 08/24/2014 0934   LDLCALC 96 08/24/2014 0934       Assessment and plan:   Keith Liu was seen today for follow-up.  Diagnoses and associated orders for this visit:  Type 2 diabetes mellitus without complication - Glucose (CBG) Continue with Metformin and we will f/u in 3 months for review. Diet and exercise discussed with patient.  HLD (hyperlipidemia) Education provided on proper lifestyle changes in order to lower cholesterol. Patient advised to maintain healthy weight and to keep total fat intake at 25-35% of total calories and carbohydrates 50-60% of total daily calories. Explained how high cholesterol places patient at risk for heart disease. Patient placed on appropriate medication and repeat labs in 6 months   Smoking Smoking cessation discussed for 3 minutes, patient is not willing to quit at this time. Will continue to assess on each visit. Discussed increased risk for diseases such as cancer, heart disease, and stroke.   Tinea pedis of both feet/Tinea versicolor - terbinafine (LAMISIL) 250 MG tablet; Take 1 tablet (250 mg total) by mouth daily. May use for 2-4 weeks.  Will cure both.   Return in about 3 months (around 12/21/2014) for DM/HTN.        Holland CommonsKECK, Fedor Kazmierski, NP-C [facility] [phone] 09/21/2014, 2:57 PM

## 2014-09-21 NOTE — Progress Notes (Signed)
Pt is here following up on his HTN, hyperlipidemia, and his diabetes.

## 2014-10-03 ENCOUNTER — Other Ambulatory Visit: Payer: Self-pay | Admitting: Internal Medicine

## 2014-10-03 ENCOUNTER — Telehealth: Payer: Self-pay | Admitting: Internal Medicine

## 2014-10-03 DIAGNOSIS — E119 Type 2 diabetes mellitus without complications: Secondary | ICD-10-CM

## 2014-10-03 MED ORDER — GLUCOSE BLOOD VI STRP: ORAL_STRIP | Status: DC

## 2014-10-03 MED ORDER — FREESTYLE SYSTEM KIT: 1.0000 | PACK | Status: DC | PRN

## 2014-10-03 MED ORDER — FREESTYLE LANCETS MISC: Status: DC

## 2014-10-03 NOTE — Telephone Encounter (Signed)
Pt informed Rx was send today to [facility]

## 2014-10-03 NOTE — Telephone Encounter (Signed)
Pt requesting script for meter, he thought he received script during during last appointment. Please f/u with pt.

## 2014-10-19 ENCOUNTER — Ambulatory Visit: Payer: Medicaid Other

## 2014-10-29 ENCOUNTER — Ambulatory Visit: Payer: Medicaid Other | Admitting: Internal Medicine

## 2014-11-02 ENCOUNTER — Ambulatory Visit: Payer: Self-pay | Attending: Internal Medicine | Admitting: Internal Medicine

## 2014-11-02 ENCOUNTER — Encounter: Payer: Self-pay | Admitting: Internal Medicine

## 2014-11-02 VITALS — BP 159/114 | HR 107 | Temp 98.2°F | Resp 14 | Ht 66.0 in | Wt 238.0 lb

## 2014-11-02 DIAGNOSIS — F17200 Nicotine dependence, unspecified, uncomplicated: Secondary | ICD-10-CM | POA: Insufficient documentation

## 2014-11-02 DIAGNOSIS — I10 Essential (primary) hypertension: Secondary | ICD-10-CM | POA: Insufficient documentation

## 2014-11-02 DIAGNOSIS — Z6838 Body mass index (BMI) 38.0-38.9, adult: Secondary | ICD-10-CM | POA: Insufficient documentation

## 2014-11-02 DIAGNOSIS — E119 Type 2 diabetes mellitus without complications: Secondary | ICD-10-CM | POA: Insufficient documentation

## 2014-11-02 DIAGNOSIS — R0683 Snoring: Secondary | ICD-10-CM | POA: Insufficient documentation

## 2014-11-02 DIAGNOSIS — R0602 Shortness of breath: Secondary | ICD-10-CM | POA: Insufficient documentation

## 2014-11-02 DIAGNOSIS — E663 Overweight: Secondary | ICD-10-CM | POA: Insufficient documentation

## 2014-11-02 LAB — POCT URINALYSIS DIPSTICK
BILIRUBIN UA: NEGATIVE
Blood, UA: NEGATIVE
GLUCOSE UA: 500
Ketones, UA: NEGATIVE
Leukocytes, UA: NEGATIVE
Nitrite, UA: NEGATIVE
PROTEIN UA: NEGATIVE
SPEC GRAV UA: 1.015
Urobilinogen, UA: 0.2
pH, UA: 6.5

## 2014-11-02 LAB — GLUCOSE, POCT (MANUAL RESULT ENTRY)
POC GLUCOSE: 353 mg/dL — AB (ref 70–99)
POC GLUCOSE: 251 mg/dL — AB (ref 70–99)

## 2014-11-02 MED ORDER — METFORMIN HCL 1000 MG PO TABS: 1000.0000 mg | ORAL_TABLET | Freq: Two times a day (BID) | ORAL | Status: DC

## 2014-11-02 MED ORDER — AMLODIPINE BESYLATE 10 MG PO TABS: 10.0000 mg | ORAL_TABLET | Freq: Every day | ORAL | Status: DC

## 2014-11-02 MED ORDER — INSULIN ASPART 100 UNIT/ML ~~LOC~~ SOLN
20.0000 [IU] | Freq: Once | SUBCUTANEOUS | Status: AC
  Administered 2014-11-02: 20 [IU] via SUBCUTANEOUS

## 2014-11-02 MED ORDER — CLONIDINE HCL 0.1 MG PO TABS
0.1000 mg | ORAL_TABLET | Freq: Once | ORAL | Status: AC
  Administered 2014-11-02: 0.1 mg via ORAL

## 2014-11-02 NOTE — Progress Notes (Signed)
Pt is here following up on his HTN and diabetes. Pt is requesting a sleep study. Pt states that he cant sleep. Pt reports feeling tired and having SOB.

## 2014-11-02 NOTE — Patient Instructions (Addendum)
I have increased your BP medication to 10 mg and  I would like for you to take 1,000 mg in the morning and with dinner of your Metformin. I have sent new prescriptions for the medications changes for both, please pay attention to bottles.     Sleep Apnea  Sleep apnea is a sleep disorder characterized by abnormal pauses in breathing while you sleep. When your breathing pauses, the level of oxygen in your blood decreases. This causes you to move out of deep sleep and into light sleep. As a result, your quality of sleep is poor, and the system that carries your blood throughout your body (cardiovascular system) experiences stress. If sleep apnea remains untreated, the following conditions can develop:  High blood pressure (hypertension).  Coronary artery disease.  Inability to achieve or maintain an erection (impotence).  Impairment of your thought process (cognitive dysfunction). There are three types of sleep apnea: 1. Obstructive sleep apnea--Pauses in breathing during sleep because of a blocked airway. 2. Central sleep apnea--Pauses in breathing during sleep because the area of the brain that controls your breathing does not send the correct signals to the muscles that control breathing. 3. Mixed sleep apnea--A combination of both obstructive and central sleep apnea. RISK FACTORS The following risk factors can increase your risk of developing sleep apnea:  Being overweight.  Smoking.  Having narrow passages in your nose and throat.  Being of older age.  Being male.  Alcohol use.  Sedative and tranquilizer use.  Ethnicity. Among individuals younger than 35 years, African Americans are at increased risk of sleep apnea. SYMPTOMS   Difficulty staying asleep.  Daytime sleepiness and fatigue.  Loss of energy.  Irritability.  Loud, heavy snoring.  Morning headaches.  Trouble concentrating.  Forgetfulness.  Decreased interest in sex. DIAGNOSIS  In order to diagnose  sleep apnea, your caregiver will perform a physical examination. Your caregiver may suggest that you take a home sleep test. Your caregiver may also recommend that you spend the night in a sleep lab. In the sleep lab, several monitors record information about your heart, lungs, and brain while you sleep. Your leg and arm movements and blood oxygen level are also recorded. TREATMENT The following actions may help to resolve mild sleep apnea:  Sleeping on your side.   Using a decongestant if you have nasal congestion.   Avoiding the use of depressants, including alcohol, sedatives, and narcotics.   Losing weight and modifying your diet if you are overweight. There also are devices and treatments to help open your airway:  Oral appliances. These are custom-made mouthpieces that shift your lower jaw forward and slightly open your bite. This opens your airway.  Devices that create positive airway pressure. This positive pressure "splints" your airway open to help you breathe better during sleep. The following devices create positive airway pressure:  Continuous positive airway pressure (CPAP) device. The CPAP device creates a continuous level of air pressure with an air pump. The air is delivered to your airway through a mask while you sleep. This continuous pressure keeps your airway open.  Nasal expiratory positive airway pressure (EPAP) device. The EPAP device creates positive air pressure as you exhale. The device consists of single-use valves, which are inserted into each nostril and held in place by adhesive. The valves create very little resistance when you inhale but create much more resistance when you exhale. That increased resistance creates the positive airway pressure. This positive pressure while you exhale keeps your airway  open, making it easier to breath when you inhale again.  Bilevel positive airway pressure (BPAP) device. The BPAP device is used mainly in patients with central  sleep apnea. This device is similar to the CPAP device because it also uses an air pump to deliver continuous air pressure through a mask. However, with the BPAP machine, the pressure is set at two different levels. The pressure when you exhale is lower than the pressure when you inhale.  Surgery. Typically, surgery is only done if you cannot comply with less invasive treatments or if the less invasive treatments do not improve your condition. Surgery involves removing excess tissue in your airway to create a wider passage way. Document Released: 08/14/2002 Document Revised: 12/19/2012 Document Reviewed: 12/31/2011 [facility] Patient Information 2015 [place], [place]. This information is not intended to replace advice given to you by your health care provider. Make sure you discuss any questions you have with your health care provider.

## 2014-11-02 NOTE — Progress Notes (Signed)
Patient ID: Keith Liu, male   DOB: 03/18/1976, 39 y.o.   MRN: [id number]  CC: f/u   HPI: Keith Liu is a 39 y.o. male here today for a follow up visit.  Patient has past medical history of HTN and T2DM. He presents today for complaints of SOB. He states that he knows that he is overweight but his SOB is becoming progressively worse. He states that he is having a difficult time with sleeping, snoring, apneic events, daytime sleepiness, and headaches,  He also notes abdominal bloating and pain on his right side. The pain on his right side is described as deep and achy and is aggravated by laying on his right side. He reports feeling swollen in his abdomen.   Patient takes his medications daily but has a hard time managing his diet. He notes that he ate fruit loop and cinnamon toast crunch cereal for breakfast with sweet tea.  His girlfriend states that his blood sugars are usually 130-160.     Allergies  Allergen Reactions  . Lisinopril Swelling    Angioedema of lower lip   Past Medical History  Diagnosis Date  . Hypertension    Current Outpatient Prescriptions on File Prior to Visit  Medication Sig Dispense Refill  . amLODipine (NORVASC) 5 MG tablet Take 1 tablet (5 mg total) by mouth daily. 30 tablet 1  . atorvastatin (LIPITOR) 10 MG tablet Take 1 tablet (10 mg total) by mouth daily. 90 tablet 0  . glucose blood test strip Use as instructed 100 each 12  . glucose monitoring kit (FREESTYLE) monitoring kit 1 each by Does not apply route as needed. 1 each 0  . Lancets (FREESTYLE) lancets Use as instructed 100 each 12  . metFORMIN (GLUCOPHAGE) 500 MG tablet Take 1 tablet (500 mg total) by mouth 2 (two) times daily with a meal. 60 tablet 3  . albuterol (PROVENTIL HFA;VENTOLIN HFA) 108 (90 BASE) MCG/ACT inhaler Inhale 2 puffs into the lungs every 6 (six) hours as needed for wheezing or shortness of breath. (Patient not taking: Reported on 11/02/2014) 1 Inhaler 3  . albuterol  (PROVENTIL) (2.5 MG/3ML) 0.083% nebulizer solution Take 3 mLs (2.5 mg total) by nebulization every 6 (six) hours as needed for wheezing or shortness of breath. (Patient not taking: Reported on 11/02/2014) 150 mL 3  . ibuprofen (ADVIL,MOTRIN) 800 MG tablet Take 1,600 mg by mouth every 8 (eight) hours as needed for mild pain.    Marland Kitchen terbinafine (LAMISIL) 250 MG tablet Take 1 tablet (250 mg total) by mouth daily. May use for 2-4 weeks. (Patient not taking: Reported on 11/02/2014) 30 tablet 0  . [DISCONTINUED] lisinopril (PRINIVIL,ZESTRIL) 20 MG tablet Take 1 tablet (20 mg total) by mouth daily. 30 tablet 1   No current facility-administered medications on file prior to visit.   Family History  Problem Relation Age of Onset  . COPD Mother   . Diabetes Mother   . Hypertension Mother    History   Social History  . Marital Status: Single    Spouse Name: N/A  . Number of Children: N/A  . Years of Education: N/A   Occupational History  . Not on file.   Social History Main Topics  . Smoking status: Heavy Tobacco Smoker  . Smokeless tobacco: Not on file  . Alcohol Use: 0.0 oz/week    0 Standard drinks or equivalent per week  . Drug Use: No  . Sexual Activity: Yes   Other Topics Concern  .  Not on file   Social History Narrative    Review of Systems  Eyes: Positive for blurred vision.  Respiratory: Positive for shortness of breath. Negative for cough and wheezing.   Cardiovascular: Positive for leg swelling. Negative for chest pain and palpitations.  Gastrointestinal: Positive for abdominal pain. Negative for nausea and vomiting.  Neurological: Positive for tingling and headaches. Negative for dizziness.  All other systems reviewed and are negative.      Objective:   Filed Vitals:   11/02/14 0932  BP: 164/107  Pulse: 107  Temp: 98.2 F (36.8 C)  Resp: 14    Physical Exam  Constitutional: He is oriented to person, place, and time.  Cardiovascular: Normal rate, regular  rhythm and normal heart sounds.   Pulmonary/Chest: Effort normal and breath sounds normal. He has no wheezes.  Abdominal: Soft. Bowel sounds are normal. He exhibits distension. There is no tenderness.  Centripetal obesity    Musculoskeletal: He exhibits edema.  Neurological: He is alert and oriented to person, place, and time.  Skin: Skin is warm and dry.  Psychiatric: He has a normal mood and affect.  '  Lab Results  Component Value Date   WBC 8.7 08/24/2014   HGB 14.8 08/24/2014   HCT 44.9 08/24/2014   MCV 80.8 08/24/2014   PLT 274 08/24/2014   Lab Results  Component Value Date   CREATININE 0.84 08/24/2014   BUN 9 08/24/2014   NA 141 08/24/2014   K 4.7 08/24/2014   CL 101 08/24/2014   CO2 27 08/24/2014    Lab Results  Component Value Date   HGBA1C 8.0* 08/24/2014   Lipid Panel     Component Value Date/Time   CHOL 177 08/24/2014 0934   TRIG 192* 08/24/2014 0934   HDL 43 08/24/2014 0934   CHOLHDL 4.1 08/24/2014 0934   VLDL 38 08/24/2014 0934   LDLCALC 96 08/24/2014 0934       Assessment and plan:   Keith Liu was seen today for follow-up.  Diagnoses and all orders for this visit:  Type 2 diabetes mellitus without complication Orders: -     Glucose (CBG) -     insulin aspart (novoLOG) injection 20 Units; Inject 0.2 mLs (20 Units total) into the skin once. -     POCT urinalysis dipstick -     Increased to metFORMIN (GLUCOPHAGE) 1000 MG tablet; Take 1 tablet (1,000 mg total) by mouth 2 (two) times daily with a meal. -     Glucose (CBG)  Essential hypertension Orders: -     cloNIDine (CATAPRES) tablet 0.1 mg; Take 1 tablet (0.1 mg total) by mouth once. -     Increased to amLODipine (NORVASC) 10 MG tablet; Take 1 tablet (10 mg total) by mouth daily. Patient blood pressure remains elevated today, will increase BP medication and have patient to return in 2 weeks for blood pressure recheck with nurse. Stressed diet changes, regular exercise regimen, and modifiable  risk factors. Will follow up with CMP as needed, Will follow up with patient in 3-6 months.   SOB (shortness of breath) Orders: -     Ambulatory referral to Sleep Studies--he will go soon and pay out of pocket. I would first like to start with r/o OSA. I would also like to r/o CHF and get a Echo soon.  Patient reports that he is waiting to hear back from Medicaid, but plans to apply for orange card while he waits on Medicaid   Return for call me  in 1 week with BP readings and blood sugar levels, 2 months with me .       Chari Manning, NP-C [facility] [phone] 11/02/2014, 10:10 AM

## 2014-11-06 ENCOUNTER — Telehealth: Payer: Self-pay | Admitting: *Deleted

## 2014-11-06 DIAGNOSIS — I10 Essential (primary) hypertension: Secondary | ICD-10-CM | POA: Insufficient documentation

## 2014-11-06 DIAGNOSIS — R0602 Shortness of breath: Secondary | ICD-10-CM | POA: Insufficient documentation

## 2014-11-06 NOTE — Telephone Encounter (Signed)
LVM wih male to return call     Call this patient and find out if he had his sleep study yet? Also find out if he has had his appointment for the orange card.  Remind him to have the sleep center to send me the results and call me once he has orange card so I can put in a Echo of his heart. Thanks

## 2014-12-07 ENCOUNTER — Encounter: Payer: Self-pay | Admitting: Internal Medicine

## 2014-12-07 ENCOUNTER — Ambulatory Visit: Payer: Self-pay | Attending: Internal Medicine | Admitting: Internal Medicine

## 2014-12-07 VITALS — BP 140/94 | HR 102 | Temp 98.0°F | Resp 14 | Ht 64.0 in | Wt 235.0 lb

## 2014-12-07 DIAGNOSIS — R0602 Shortness of breath: Secondary | ICD-10-CM

## 2014-12-07 DIAGNOSIS — Z720 Tobacco use: Secondary | ICD-10-CM | POA: Insufficient documentation

## 2014-12-07 DIAGNOSIS — I10 Essential (primary) hypertension: Secondary | ICD-10-CM

## 2014-12-07 DIAGNOSIS — E119 Type 2 diabetes mellitus without complications: Secondary | ICD-10-CM

## 2014-12-07 LAB — GLUCOSE, POCT (MANUAL RESULT ENTRY): POC Glucose: 233 mg/dl — AB (ref 70–99)

## 2014-12-07 LAB — POCT GLYCOSYLATED HEMOGLOBIN (HGB A1C): HEMOGLOBIN A1C: 10.3

## 2014-12-07 MED ORDER — ALBUTEROL SULFATE 108 (90 BASE) MCG/ACT IN AEPB: 2.0000 | INHALATION_SPRAY | Freq: Four times a day (QID) | RESPIRATORY_TRACT | Status: DC | PRN

## 2014-12-07 MED ORDER — GLIPIZIDE 10 MG PO TABS: 10.0000 mg | ORAL_TABLET | Freq: Two times a day (BID) | ORAL | Status: DC

## 2014-12-07 MED ORDER — HYDROCHLOROTHIAZIDE 25 MG PO TABS: 25.0000 mg | ORAL_TABLET | Freq: Every day | ORAL | Status: DC

## 2014-12-07 NOTE — Progress Notes (Signed)
Pt is here following up on his HTN and diabetes. Pt stopped taking his BS medications b/c they were making him sick w/ vomiting. Pt states that he is exercising and he is eating healthier.

## 2014-12-07 NOTE — Progress Notes (Signed)
Patient ID: Keith Liu, male   DOB: [birth year], 39 y.o.   MRN: [id number]  CC: DM f/u  HPI: Keith Liu is a 39 y.o. male here today for a follow up visit.  Patient has past medical history of HTN and T2DM.  He states that he still has not been to have a sleep study because he could not afford the procedure and he has not applied for the orange card. He reports that he stopped taking his Metformin because it cause GI upset. During the time he quit the medication he has not been exercising to lose weight or following a diabetic diet. He would like to have his diabetic medication switched if possible.   Allergies  Allergen Reactions  . Lisinopril Swelling    Angioedema of lower lip   Past Medical History  Diagnosis Date  . Hypertension    Current Outpatient Prescriptions on File Prior to Visit  Medication Sig Dispense Refill  . glucose blood test strip Use as instructed 100 each 12  . glucose monitoring kit (FREESTYLE) monitoring kit 1 each by Does not apply route as needed. 1 each 0  . Lancets (FREESTYLE) lancets Use as instructed 100 each 12  . albuterol (PROVENTIL HFA;VENTOLIN HFA) 108 (90 BASE) MCG/ACT inhaler Inhale 2 puffs into the lungs every 6 (six) hours as needed for wheezing or shortness of breath. (Patient not taking: Reported on 11/02/2014) 1 Inhaler 3  . albuterol (PROVENTIL) (2.5 MG/3ML) 0.083% nebulizer solution Take 3 mLs (2.5 mg total) by nebulization every 6 (six) hours as needed for wheezing or shortness of breath. (Patient not taking: Reported on 11/02/2014) 150 mL 3  . amLODipine (NORVASC) 10 MG tablet Take 1 tablet (10 mg total) by mouth daily. (Patient not taking: Reported on 12/07/2014) 30 tablet 4  . atorvastatin (LIPITOR) 10 MG tablet Take 1 tablet (10 mg total) by mouth daily. (Patient not taking: Reported on 12/07/2014) 90 tablet 0  . ibuprofen (ADVIL,MOTRIN) 800 MG tablet Take 1,600 mg by mouth every 8 (eight) hours as needed for mild pain.    . metFORMIN  (GLUCOPHAGE) 1000 MG tablet Take 1 tablet (1,000 mg total) by mouth 2 (two) times daily with a meal. (Patient not taking: Reported on 12/07/2014) 60 tablet 4  . terbinafine (LAMISIL) 250 MG tablet Take 1 tablet (250 mg total) by mouth daily. May use for 2-4 weeks. (Patient not taking: Reported on 11/02/2014) 30 tablet 0  . [DISCONTINUED] lisinopril (PRINIVIL,ZESTRIL) 20 MG tablet Take 1 tablet (20 mg total) by mouth daily. 30 tablet 1   No current facility-administered medications on file prior to visit.   Family History  Problem Relation Age of Onset  . COPD Mother   . Diabetes Mother   . Hypertension Mother    History   Social History  . Marital Status: Single    Spouse Name: N/A  . Number of Children: N/A  . Years of Education: N/A   Occupational History  . Not on file.   Social History Main Topics  . Smoking status: Heavy Tobacco Smoker  . Smokeless tobacco: Not on file  . Alcohol Use: 0.0 oz/week    0 Standard drinks or equivalent per week  . Drug Use: No  . Sexual Activity: Yes   Other Topics Concern  . Not on file   Social History Narrative    Review of Systems  Eyes: Positive for blurred vision.  Respiratory: Positive for cough and shortness of breath.   Cardiovascular: Negative.  Gastrointestinal: Negative.   Genitourinary: Negative for frequency.  Neurological: Positive for tingling and headaches.  Endo/Heme/Allergies: Negative for polydipsia.      Objective:   Filed Vitals:   12/07/14 1501  BP: 140/94  Pulse: 102  Temp: 98 F (36.7 C)  Resp: 14    Physical Exam  Constitutional: He is oriented to person, place, and time.  Cardiovascular: Normal rate, regular rhythm and normal heart sounds.   Pulmonary/Chest: Effort normal and breath sounds normal. He has no wheezes.  Abdominal:  Obese abdomen   Neurological: He is alert and oriented to person, place, and time.     Lab Results  Component Value Date   WBC 8.7 08/24/2014   HGB 14.8  08/24/2014   HCT 44.9 08/24/2014   MCV 80.8 08/24/2014   PLT 274 08/24/2014   Lab Results  Component Value Date   CREATININE 0.84 08/24/2014   BUN 9 08/24/2014   NA 141 08/24/2014   K 4.7 08/24/2014   CL 101 08/24/2014   CO2 27 08/24/2014    Lab Results  Component Value Date   HGBA1C 10.3 12/07/2014   Lipid Panel     Component Value Date/Time   CHOL 177 08/24/2014 0934   TRIG 192* 08/24/2014 0934   HDL 43 08/24/2014 0934   CHOLHDL 4.1 08/24/2014 0934   VLDL 38 08/24/2014 0934   LDLCALC 96 08/24/2014 0934       Assessment and plan:   [name] was seen today for follow-up.  Diagnoses and all orders for this visit:  Type 2 diabetes mellitus without complication Orders: -     Glucose (CBG) -     HgB A1c -     glipiZIDE (GLUCOTROL) 10 MG tablet; Take 1 tablet (10 mg total) by mouth 2 (two) times daily before a meal.  I have discontinued patient off Metformin due to unlikely to be compliant. Patient explained that he attempted to decrease back down to 500 mg of Metformin but continued to have abdominal pain.   Essential hypertension Orders: -     hydrochlorothiazide (HYDRODIURIL) 25 MG tablet; Take 1 tablet (25 mg total) by mouth daily. BP is not controlled on Norvasc, will add on HCTZ for better control. Went over diet and exercise to help   SOB (shortness of breath) Orders: -     Albuterol Sulfate (PROAIR RESPICLICK) 751 (90 BASE) MCG/ACT AEPB; Inhale 2 puffs into the lungs every 6 (six) hours as needed. Highly advised patient to get sleep study. I have went over complications of sleep apnea and explained that he is at high risk for sleep apnea due to weight  Return for 2-3 weeks BP/cbg check with RN.       Chari Manning, [place] [facility] [phone] 12/07/2014, 3:24 PM

## 2014-12-07 NOTE — Patient Instructions (Signed)
May stop taking Metformin and switch to Glipizide 10 mg. You will take this twice per day. Please check your sugar and bring your log book back in 2-3 weeks for review. Thanks. If you have any problems please call clinic or come if no one answers

## 2014-12-11 ENCOUNTER — Telehealth: Payer: Self-pay | Admitting: *Deleted

## 2014-12-11 NOTE — Telephone Encounter (Signed)
Patient's wife called in this morning stating that he noticed blood in his semen this morning.  Patient denies pain or any injury to his testicles in the past 24 hours.  Patient also denies multiple sex partners.  They are requesting an appointment with Vikki PortsValerie.  Explained that she did not have any openings but that I would give this message to her in the event she would like to work them in.  They are available all day tomorrow to come in.

## 2014-12-11 NOTE — Telephone Encounter (Signed)
I do not have a opening until Friday. So if he needs to be seen before then to avoid him from going to the ER he may use walk in clinic. He will likely need to be tested for STD's and have a UA. Thanks

## 2014-12-12 ENCOUNTER — Ambulatory Visit: Payer: Self-pay | Attending: Internal Medicine | Admitting: Family Medicine

## 2014-12-12 ENCOUNTER — Telehealth: Payer: Self-pay | Admitting: Family Medicine

## 2014-12-12 VITALS — BP 163/129 | HR 102 | Temp 98.0°F | Resp 16

## 2014-12-12 DIAGNOSIS — R361 Hematospermia: Secondary | ICD-10-CM

## 2014-12-12 LAB — POCT URINALYSIS DIPSTICK
BILIRUBIN UA: NEGATIVE
Blood, UA: NEGATIVE
GLUCOSE UA: 500
Ketones, UA: 15
Leukocytes, UA: NEGATIVE
Nitrite, UA: NEGATIVE
PH UA: 6
PROTEIN UA: 100
Spec Grav, UA: 1.025
UROBILINOGEN UA: 1

## 2014-12-12 NOTE — Progress Notes (Signed)
Patient noticed blood in semen yesterday after sex No injury to penis noted and no pain involved No pain when urinating

## 2014-12-12 NOTE — Addendum Note (Signed)
Addended by: Concepcion LivingBERNHARDT, LINDA C on: 12/12/2014 12:07 PM   Modules accepted: Level of Service

## 2014-12-12 NOTE — Progress Notes (Signed)
Patient ID: Mckinley JewelCharles L Clinch, male   DOB: [birth year], 39 y.o.   MRN: [id number]   CC;  Blood in semen.   Subjective:  Patient presents with a complaint of blood in semen with masturbation two days ago. He has never noticed this before.  He denies it being overly vigorous masturbation. He denies any pain with urination, Denies any frequency, urgency, flank pain. What he saw was pink tinged semen. He denies any prostate enlargement symptoms.  Objective:  Exam was deferred.  Assessment:  Blood in Semen   Plan:  GC and chamydia, and urinate dip.  Will follow-up past those results.     Henrietta HooverLinda C. Bernhardt, FNP-BC [facility] Phone:  [phone]

## 2014-12-13 LAB — GC/CHLAMYDIA PROBE AMP, URINE
CHLAMYDIA, SWAB/URINE, PCR: NEGATIVE
GC Probe Amp, Urine: NEGATIVE

## 2014-12-14 ENCOUNTER — Telehealth: Payer: Self-pay | Admitting: *Deleted

## 2014-12-14 NOTE — Telephone Encounter (Signed)
Relayed test results were negative and to follow up as needed with Keith Liu if problem continues

## 2014-12-14 NOTE — Telephone Encounter (Signed)
-----   Message from Henrietta HooverLinda C Bernhardt, NP sent at 12/13/2014  2:02 PM EDT ----- Test we did were negative as you thought. Follow-up with Holland CommonsValerie Keck if this happens again.

## 2015-02-06 ENCOUNTER — Ambulatory Visit: Payer: Self-pay | Admitting: Internal Medicine

## 2015-02-26 ENCOUNTER — Ambulatory Visit: Payer: Self-pay | Admitting: Internal Medicine

## 2015-03-01 ENCOUNTER — Emergency Department: Payer: Self-pay

## 2015-03-01 ENCOUNTER — Emergency Department
Admission: EM | Admit: 2015-03-01 | Discharge: 2015-03-01 | Disposition: A | Discharge status: Home / Self Care | Payer: Self-pay | Attending: Emergency Medicine | Admitting: Emergency Medicine

## 2015-03-01 ENCOUNTER — Other Ambulatory Visit: Payer: Self-pay

## 2015-03-01 ENCOUNTER — Encounter: Payer: Self-pay | Admitting: Medical Oncology

## 2015-03-01 DIAGNOSIS — E119 Type 2 diabetes mellitus without complications: Secondary | ICD-10-CM | POA: Insufficient documentation

## 2015-03-01 DIAGNOSIS — I10 Essential (primary) hypertension: Secondary | ICD-10-CM | POA: Insufficient documentation

## 2015-03-01 DIAGNOSIS — Z720 Tobacco use: Secondary | ICD-10-CM | POA: Insufficient documentation

## 2015-03-01 DIAGNOSIS — M542 Cervicalgia: Secondary | ICD-10-CM | POA: Insufficient documentation

## 2015-03-01 DIAGNOSIS — R079 Chest pain, unspecified: Secondary | ICD-10-CM | POA: Insufficient documentation

## 2015-03-01 HISTORY — DX: Type 2 diabetes mellitus without complications: E11.9

## 2015-03-01 LAB — CBC
HCT: 50.1 % (ref 40.0–52.0)
HEMOGLOBIN: 16.1 g/dL (ref 13.0–18.0)
MCH: 26 pg (ref 26.0–34.0)
MCHC: 32.1 g/dL (ref 32.0–36.0)
MCV: 80.9 fL (ref 80.0–100.0)
Platelets: 160 10*3/uL (ref 150–440)
RBC: 6.18 MIL/uL — ABNORMAL HIGH (ref 4.40–5.90)
RDW: 15.5 % — AB (ref 11.5–14.5)
WBC: 7.7 10*3/uL (ref 3.8–10.6)

## 2015-03-01 LAB — BASIC METABOLIC PANEL
Anion gap: 11 (ref 5–15)
BUN: 8 mg/dL (ref 6–20)
CO2: 26 mmol/L (ref 22–32)
Calcium: 8.8 mg/dL — ABNORMAL LOW (ref 8.9–10.3)
Chloride: 101 mmol/L (ref 101–111)
Creatinine, Ser: 0.76 mg/dL (ref 0.61–1.24)
GFR calc Af Amer: >60 mL/min (ref >60)
GLUCOSE: 281 mg/dL — AB (ref 65–99)
Potassium: 4 mmol/L (ref 3.5–5.1)
Sodium: 138 mmol/L (ref 135–145)

## 2015-03-01 LAB — TROPONIN I: Troponin I: <0.03 ng/mL (ref <0.031)

## 2015-03-01 MED ORDER — DIAZEPAM 5 MG PO TABS: 5.0000 mg | ORAL_TABLET | Freq: Three times a day (TID) | ORAL | Status: DC | PRN

## 2015-03-01 MED ORDER — IBUPROFEN 800 MG PO TABS: 800.0000 mg | ORAL_TABLET | Freq: Three times a day (TID) | ORAL | Status: DC | PRN

## 2015-03-01 NOTE — Discharge Instructions (Signed)
Chest Pain (Nonspecific) °It is often hard to give a specific diagnosis for the cause of chest pain. There is always a chance that your pain could be related to something serious, such as a heart attack or a blood clot in the lungs. You need to follow up with your health care provider for further evaluation. °CAUSES  °· Heartburn. °· Pneumonia or bronchitis. °· Anxiety or stress. °· Inflammation around your heart (pericarditis) or lung (pleuritis or pleurisy). °· A blood clot in the lung. °· A collapsed lung (pneumothorax). It can develop suddenly on its own (spontaneous pneumothorax) or from trauma to the chest. °· Shingles infection (herpes zoster virus). °The chest wall is composed of bones, muscles, and cartilage. Any of these can be the source of the pain. °· The bones can be bruised by injury. °· The muscles or cartilage can be strained by coughing or overwork. °· The cartilage can be affected by inflammation and become sore (costochondritis). °DIAGNOSIS  °Lab tests or other studies may be needed to find the cause of your pain. Your health care provider may have you take a test called an ambulatory electrocardiogram (ECG). An ECG records your heartbeat patterns over a 24-hour period. You may also have other tests, such as: °· Transthoracic echocardiogram (TTE). During echocardiography, sound waves are used to evaluate how blood flows through your heart. °· Transesophageal echocardiogram (TEE). °· Cardiac monitoring. This allows your health care provider to monitor your heart rate and rhythm in real time. °· Holter monitor. This is a portable device that records your heartbeat and can help diagnose heart arrhythmias. It allows your health care provider to track your heart activity for several days, if needed. °· Stress tests by exercise or by giving medicine that makes the heart beat faster. °TREATMENT  °· Treatment depends on what may be causing your chest pain. Treatment may include: °¨ Acid blockers for  heartburn. °¨ Anti-inflammatory medicine. °¨ Pain medicine for inflammatory conditions. °¨ Antibiotics if an infection is present. °· You may be advised to change lifestyle habits. This includes stopping smoking and avoiding alcohol, caffeine, and chocolate. °· You may be advised to keep your head raised (elevated) when sleeping. This reduces the chance of acid going backward from your stomach into your esophagus. °Most of the time, nonspecific chest pain will improve within 2-3 days with rest and mild pain medicine.  °HOME CARE INSTRUCTIONS  °· If antibiotics were prescribed, take them as directed. Finish them even if you start to feel better. °· For the next few days, avoid physical activities that bring on chest pain. Continue physical activities as directed. °· Do not use any tobacco products, including cigarettes, chewing tobacco, or electronic cigarettes. °· Avoid drinking alcohol. °· Only take medicine as directed by your health care provider. °· Follow your health care provider's suggestions for further testing if your chest pain does not go away. °· Keep any follow-up appointments you made. If you do not go to an appointment, you could develop lasting (chronic) problems with pain. If there is any problem keeping an appointment, call to reschedule. °SEEK MEDICAL CARE IF:  °· Your chest pain does not go away, even after treatment. °· You have a rash with blisters on your chest. °· You have a fever. °SEEK IMMEDIATE MEDICAL CARE IF:  °· You have increased chest pain or pain that spreads to your arm, neck, jaw, back, or abdomen. °· You have shortness of breath. °· You have an increasing cough, or you cough   up blood.  You have severe back or abdominal pain.  You feel nauseous or vomit.  You have severe weakness.  You faint.  You have chills. This is an emergency. Do not wait to see if the pain will go away. Get medical help at once. Call your local emergency services (911 in [place] Do not drive  yourself to the hospital. MAKE SURE YOU:   Understand these instructions.  Will watch your condition.  Will get help right away if you are not doing well or get worse. Document Released: 06/03/2005 Document Revised: 08/29/2013 Document Reviewed: 03/29/2008 [facility] Patient Information 2015 [place], [place]. This information is not intended to replace advice given to you by your health care provider. Make sure you discuss any questions you have with your health care provider. Torticollis, Acute You have suddenly (acutely) developed a twisted neck (torticollis). This is usually a self-limited condition. CAUSES  Acute torticollis may be caused by malposition, trauma or infection. Most commonly, acute torticollis is caused by sleeping in an awkward position. Torticollis may also be caused by the flexion, extension or twisting of the neck muscles beyond their normal position. Sometimes, the exact cause may not be known. SYMPTOMS  Usually, there is pain and limited movement of the neck. Your neck may twist to one side. DIAGNOSIS  The diagnosis is often made by physical examination. X-rays, CT scans or MRIs may be done if there is a history of trauma or concern of infection. TREATMENT  For a common, stiff neck that develops during sleep, treatment is focused on relaxing the contracted neck muscle. Medications (including shots) may be used to treat the problem. Most cases resolve in several days. Torticollis usually responds to conservative physical therapy. If left untreated, the shortened and spastic neck muscle can cause deformities in the face and neck. Rarely, surgery is required. HOME CARE INSTRUCTIONS   Use over-the-counter and prescription medications as directed by your caregiver.  Do stretching exercises and massage the neck as directed by your caregiver.  Follow up with physical therapy if needed and as directed by your caregiver. SEEK IMMEDIATE MEDICAL CARE IF:   You develop  difficulty breathing or noisy breathing (stridor).  You drool, develop trouble swallowing or have pain with swallowing.  You develop numbness or weakness in the hands or feet.  You have changes in speech or vision.  You have problems with urination or bowel movements.  You have difficulty walking.  You have a fever.  You have increased pain. MAKE SURE YOU:   Understand these instructions.  Will watch your condition.  Will get help right away if you are not doing well or get worse. Document Released: 08/21/2000 Document Revised: 11/16/2011 Document Reviewed: 10/02/2009 [facility] Patient Information 2015 [place], [place]. This information is not intended to replace advice given to you by your health care provider. Make sure you discuss any questions you have with your health care provider.

## 2015-03-01 NOTE — ED Notes (Signed)
Pt ambulatory to triage with reports that he began having cramping to his neck 2 days ago, yesterday the pain "started traveling into my neck". Pt in NAD at this time. Reports that it is most painful when he coughs.

## 2015-03-01 NOTE — ED Provider Notes (Signed)
[facility] Emergency Department Provider Note     Time seen: ----------------------------------------- 12:19 PM on 03/01/2015 -----------------------------------------    I have reviewed the triage vital signs and the nursing notes.   HISTORY  Chief Complaint Neck Pain and Chest Pain    HPI Keith Liu is a 39 y.o. male who presents to ER for left-sided neck and upper chest pain. Symptoms onset was 2 days ago, states pain sort of started in his neck and he thought "he got a crick in his neck". Patient states symptoms are worse when he coughs or moves around, it comes and goes. Nothing seems to make it better. Patient states she does take his medication. Pain is moderate to severe when it is there, dull.   Past Medical History  Diagnosis Date  . Hypertension   . Diabetes mellitus without complication     Patient Active Problem List   Diagnosis Date Noted  . Essential hypertension 11/06/2014  . SOB (shortness of breath) 11/06/2014  . DM type 2 (diabetes mellitus, type 2) 09/21/2014  . HLD (hyperlipidemia) 09/21/2014  . Smoking 09/21/2014    Past Surgical History  Procedure Laterality Date  . Knee surgery Left     Allergies Lisinopril  Social History History  Substance Use Topics  . Smoking status: Heavy Tobacco Smoker    Types: Cigarettes  . Smokeless tobacco: Not on file  . Alcohol Use: 0.0 oz/week    0 Standard drinks or equivalent per week    Review of Systems Constitutional: Negative for fever. Eyes: Negative for visual changes. ENT: Negative for sore throat. Positive for left anterior neck pain Cardiovascular: Positive for left upper chest pain Respiratory: Negative for shortness of breath. Gastrointestinal: Negative for abdominal pain, vomiting and diarrhea. Genitourinary: Negative for dysuria. Musculoskeletal: Negative for back pain. Skin: Negative for rash. Neurological: Negative for headaches, focal weakness or  numbness.  10-point ROS otherwise negative.  ____________________________________________   PHYSICAL EXAM:  VITAL SIGNS: ED Triage Vitals  Enc Vitals Group     BP 03/01/15 1036 153/102 mmHg     Pulse Rate 03/01/15 1036 100     Resp 03/01/15 1036 20     Temp 03/01/15 1036 98.1 F (36.7 C)     Temp Source 03/01/15 1036 Oral     SpO2 03/01/15 1036 97 %     Weight 03/01/15 1036 230 lb (104.327 kg)     Height 03/01/15 1036  (1.676 m)     Head Cir --      Peak Flow --      Pain Score 03/01/15 1037 6     Pain Loc --      Pain Edu? --      Excl. in GC? --     Constitutional: Alert and oriented. Well appearing and in no distress. Eyes: Conjunctivae are normal. PERRL. Normal extraocular movements. ENT   Head: Normocephalic and atraumatic.   Nose: No congestion/rhinnorhea.   Mouth/Throat: Mucous membranes are moist.   Neck: No stridor. Hematological/Lymphatic/Immunilogical: No cervical lymphadenopathy. Cardiovascular: Normal rate, regular rhythm. Normal and symmetric distal pulses are present in all extremities. No murmurs, rubs, or gallops. Respiratory: Normal respiratory effort without tachypnea nor retractions. Breath sounds are clear and equal bilaterally. No wheezes/rales/rhonchi. Gastrointestinal: Soft and nontender. No distention. No abdominal bruits. There is no CVA tenderness. Musculoskeletal: Nontender with normal range of motion in all extremities. No joint effusions.  No lower extremity tenderness nor edema. Neurologic:  Normal speech and language. No  gross focal neurologic deficits are appreciated. Speech is normal. No gait instability. Skin:  Skin is warm, dry and intact. No rash noted. Psychiatric: Mood and affect are normal. Speech and behavior are normal. Patient exhibits appropriate insight and judgment. ____________________________________________  EKG: Interpreted by me. Sinus tachycardia with rate 105, first-degree AV block, right superior axis  deviation, no evidence of hypertrophy or acute infarction.  ____________________________________________  ED COURSE:  Pertinent labs & imaging results that were available during my care of the patient were reviewed by me and considered in my medical decision making (see chart for details). Patient is in no acute distress, this seems muscular. We'll check cardiac labs and reevaluate. ____________________________________________    LABS (pertinent positives/negatives)  Labs Reviewed  CBC - Abnormal; Notable for the following:    RBC 6.18 (*)    RDW 15.5 (*)    All other components within normal limits  BASIC METABOLIC PANEL - Abnormal; Notable for the following:    Glucose, Bld 281 (*)    Calcium 8.8 (*)    All other components within normal limits  TROPONIN I    RADIOLOGY Images were viewed by me  Chest x-ray IMPRESSION: 1. Mild chronic cardiomegaly. If this has not been previously worked up then cardiology follow up referral may be warranted. 2. Otherwise, no significant abnormalities are observed. ____________________________________________  FINAL ASSESSMENT AND PLAN  Chest and neck pain  Plan: Pain seems musculoskeletal. Heart score is 2. Patient does need follow-up with cardiology, this point in no acute distress. Discharge with Motrin and Valium.   Emily Filbert, MD   Emily Filbert, MD 03/01/15 [id number]

## 2015-03-08 ENCOUNTER — Ambulatory Visit: Payer: Self-pay | Admitting: Internal Medicine

## 2015-03-12 ENCOUNTER — Ambulatory Visit: Payer: Self-pay | Attending: Internal Medicine | Admitting: Internal Medicine

## 2015-03-12 ENCOUNTER — Encounter: Payer: Self-pay | Admitting: Internal Medicine

## 2015-03-12 VITALS — BP 133/87 | HR 96 | Temp 98.7°F | Ht 66.0 in | Wt 231.0 lb

## 2015-03-12 DIAGNOSIS — Z79899 Other long term (current) drug therapy: Secondary | ICD-10-CM | POA: Insufficient documentation

## 2015-03-12 DIAGNOSIS — I10 Essential (primary) hypertension: Secondary | ICD-10-CM | POA: Insufficient documentation

## 2015-03-12 DIAGNOSIS — R0602 Shortness of breath: Secondary | ICD-10-CM | POA: Insufficient documentation

## 2015-03-12 DIAGNOSIS — E785 Hyperlipidemia, unspecified: Secondary | ICD-10-CM | POA: Insufficient documentation

## 2015-03-12 DIAGNOSIS — I517 Cardiomegaly: Secondary | ICD-10-CM | POA: Insufficient documentation

## 2015-03-12 DIAGNOSIS — F17210 Nicotine dependence, cigarettes, uncomplicated: Secondary | ICD-10-CM | POA: Insufficient documentation

## 2015-03-12 DIAGNOSIS — E119 Type 2 diabetes mellitus without complications: Secondary | ICD-10-CM | POA: Insufficient documentation

## 2015-03-12 LAB — GLUCOSE, POCT (MANUAL RESULT ENTRY): POC Glucose: 241 mg/dl — AB (ref 70–99)

## 2015-03-12 LAB — POCT GLYCOSYLATED HEMOGLOBIN (HGB A1C): Hemoglobin A1C: 9.8

## 2015-03-12 MED ORDER — AMLODIPINE BESYLATE 10 MG PO TABS: 10.0000 mg | ORAL_TABLET | Freq: Every day | ORAL | Status: DC

## 2015-03-12 MED ORDER — GLIPIZIDE 10 MG PO TABS: 10.0000 mg | ORAL_TABLET | Freq: Two times a day (BID) | ORAL | Status: DC

## 2015-03-12 MED ORDER — ATORVASTATIN CALCIUM 10 MG PO TABS: 10.0000 mg | ORAL_TABLET | Freq: Every day | ORAL | Status: DC

## 2015-03-12 MED ORDER — METFORMIN HCL 500 MG PO TABS: 500.0000 mg | ORAL_TABLET | Freq: Two times a day (BID) | ORAL | Status: DC

## 2015-03-12 MED ORDER — HYDROCHLOROTHIAZIDE 25 MG PO TABS: 25.0000 mg | ORAL_TABLET | Freq: Every day | ORAL | Status: DC

## 2015-03-12 MED ORDER — ALBUTEROL SULFATE HFA 108 (90 BASE) MCG/ACT IN AERS: 2.0000 | INHALATION_SPRAY | Freq: Four times a day (QID) | RESPIRATORY_TRACT | Status: DC | PRN

## 2015-03-12 NOTE — Progress Notes (Signed)
Patient ID: Keith Liu, male   DOB: Oct 06, 1975, 39 y.o.   MRN: [id number]  CC: DM f/u, ED f/u  HPI: Keith Liu is a 39 y.o. male here today for a follow up visit.  Patient has past medical history of Sleep apnea, T2DM, HTN, and obesity. He was recently seen in the ER on 03/01/15 for neck pain and was found to have mild cardiomegaly on chest x-ray. Patient had no symptoms of heart failure and was told to follow up with a cardiologist. Patient also reports that he still has been unable to get a sleep study due to the lack of funds and not applying for orange card. Since last visit patient was placed on Glipizide 10 mg twice daily and discontinued off Metformin due to GI side effects. He has failed to keep his follow up appointments for medication management in between PCP visits. He denies symptoms of blurred vision, neuropathy, polyuria, or polydipsia. He has not lost weight and still does not follow a diabetic diet.   Patient has No headache, No chest pain, No abdominal pain - No Nausea, No new weakness tingling or numbness, No Cough.  Allergies  Allergen Reactions  . Lisinopril Swelling    Angioedema of lower lip   Past Medical History  Diagnosis Date  . Hypertension   . Diabetes mellitus without complication    Current Outpatient Prescriptions on File Prior to Visit  Medication Sig Dispense Refill  . Albuterol Sulfate (PROAIR RESPICLICK) 425 (90 BASE) MCG/ACT AEPB Inhale 2 puffs into the lungs every 6 (six) hours as needed. 1 each 0  . amLODipine (NORVASC) 10 MG tablet Take 1 tablet (10 mg total) by mouth daily. (Patient not taking: Reported on 12/12/2014) 30 tablet 4  . atorvastatin (LIPITOR) 10 MG tablet Take 1 tablet (10 mg total) by mouth daily. 90 tablet 0  . diazepam (VALIUM) 5 MG tablet Take 1 tablet (5 mg total) by mouth every 8 (eight) hours as needed for muscle spasms. 20 tablet 0  . glipiZIDE (GLUCOTROL) 10 MG tablet Take 1 tablet (10 mg total) by mouth 2 (two) times  daily before a meal. 60 tablet 3  . glucose blood test strip Use as instructed 100 each 12  . glucose monitoring kit (FREESTYLE) monitoring kit 1 each by Does not apply route as needed. 1 each 0  . hydrochlorothiazide (HYDRODIURIL) 25 MG tablet Take 1 tablet (25 mg total) by mouth daily. 30 tablet 3  . ibuprofen (ADVIL,MOTRIN) 800 MG tablet Take 1,600 mg by mouth every 8 (eight) hours as needed for mild pain.    Marland Kitchen ibuprofen (ADVIL,MOTRIN) 800 MG tablet Take 1 tablet (800 mg total) by mouth every 8 (eight) hours as needed. 30 tablet 0  . Lancets (FREESTYLE) lancets Use as instructed 100 each 12  . [DISCONTINUED] lisinopril (PRINIVIL,ZESTRIL) 20 MG tablet Take 1 tablet (20 mg total) by mouth daily. 30 tablet 1   No current facility-administered medications on file prior to visit.   Family History  Problem Relation Age of Onset  . COPD Mother   . Diabetes Mother   . Hypertension Mother    History   Social History  . Marital Status: Single    Spouse Name: N/A  . Number of Children: N/A  . Years of Education: N/A   Occupational History  . Not on file.   Social History Main Topics  . Smoking status: Heavy Tobacco Smoker    Types: Cigarettes  . Smokeless tobacco: Not on file  .  Alcohol Use: 0.0 oz/week    0 Standard drinks or equivalent per week  . Drug Use: No  . Sexual Activity: Yes   Other Topics Concern  . Not on file   Social History Narrative    Review of Systems  Respiratory: Positive for shortness of breath and wheezing. Negative for cough.   Cardiovascular: Negative for chest pain, palpitations and leg swelling.  Neurological: Negative for dizziness.  All other systems reviewed and are negative.  Objective:   Filed Vitals:   03/12/15 1617  BP: 133/87  Pulse: 96  Temp: 98.7 F (37.1 C)    Physical Exam  Constitutional: He is oriented to person, place, and time.  Cardiovascular: Normal rate, regular rhythm and normal heart sounds.   No murmur  heard. Pulmonary/Chest: Effort normal and breath sounds normal.  Abdominal:  Round obese abdomen  Neurological: He is alert and oriented to person, place, and time.  Skin: Skin is warm and dry.     Lab Results  Component Value Date   WBC 7.7 03/01/2015   HGB 16.1 03/01/2015   HCT 50.1 03/01/2015   MCV 80.9 03/01/2015   PLT 160 03/01/2015   Lab Results  Component Value Date   CREATININE 0.76 03/01/2015   BUN 8 03/01/2015   NA 138 03/01/2015   K 4.0 03/01/2015   CL 101 03/01/2015   CO2 26 03/01/2015    Lab Results  Component Value Date   HGBA1C 10.3 12/07/2014   Lipid Panel     Component Value Date/Time   CHOL 177 08/24/2014 0934   TRIG 192* 08/24/2014 0934   HDL 43 08/24/2014 0934   CHOLHDL 4.1 08/24/2014 0934   VLDL 38 08/24/2014 0934   LDLCALC 96 08/24/2014 0934       Assessment and plan:   Keith Liu was seen today for referral.  Diagnoses and all orders for this visit:  Type 2 diabetes mellitus without complication Orders: -     Microalbumin, urine -     POCT glucose (manual entry) -     POCT glycosylated hemoglobin (Hb A1C) -     Refill glipiZIDE (GLUCOTROL) 10 MG tablet; Take 1 tablet (10 mg total) by mouth 2 (two) times daily before a meal. Patient states that he started back on Metformin as well as the glipizide. I have went over long term complications of uncontrolled diabetes.  Essential hypertension Orders: -     hydrochlorothiazide (HYDRODIURIL) 25 MG tablet; Take 1 tablet (25 mg total) by mouth daily. -     amLODipine (NORVASC) 10 MG tablet; Take 1 tablet (10 mg total) by mouth daily. Patient blood pressure is stable and may continue on current medication.  Education on diet, exercise, and modifiable risk factors discussed. Will obtain appropriate labs as needed. Will follow up in 3-6 months.   Cardiomegaly I have advised patient that sleep apnea, HTN, obesity, and DM all place him at risk for heart disease. I have asked patient to schedule  sleep study soon. I will refer him to Dr. Verl Liu for further evaluation.   HLD (hyperlipidemia) Orders: -     atorvastatin (LIPITOR) 10 MG tablet; Take 1 tablet (10 mg total) by mouth daily. Education provided on proper lifestyle changes in order to lower cholesterol. Patient advised to maintain healthy weight and to keep total fat intake at 25-35% of total calories and carbohydrates 50-60% of total daily calories. Explained how high cholesterol places patient at risk for heart disease. Patient placed on appropriate medication  and repeat labs in 6 months   SOB (shortness of breath) Orders: -     Refill albuterol (PROVENTIL HFA;VENTOLIN HFA) 108 (90 BASE) MCG/ACT inhaler; Inhale 2 puffs into the lungs every 6 (six) hours as needed for wheezing or shortness of breath.  Morbid obesity  Weight loss discussed at length and its complications to health.  Patient will loss 10 months by next visit in 3 months.  Diet and exercise discussed as well as calorie intake.   Return for Dr. Verl Liu tomorrow, 3 weeks RN-log review, and 3 mo PCP .       Chari Manning, NP-C [facility] [phone] 03/12/2015, 4:06 PM

## 2015-03-12 NOTE — Progress Notes (Signed)
Patient here for follow up after ED told him he had "an enlarged heart." He did not take is DM2 meds today and needs some refills

## 2015-03-12 NOTE — Patient Instructions (Signed)
They will make you a cardiology appt tomorrow   Please stop smoking asap   Nurse visit here in 3 weeks  Get sleep study

## 2015-03-13 ENCOUNTER — Encounter: Payer: Self-pay | Admitting: Cardiology

## 2015-03-13 ENCOUNTER — Ambulatory Visit: Payer: Self-pay | Attending: Cardiology | Admitting: Cardiology

## 2015-03-13 VITALS — BP 144/94 | HR 95 | Temp 97.7°F | Resp 16 | Ht 66.0 in | Wt 231.6 lb

## 2015-03-13 DIAGNOSIS — Z79899 Other long term (current) drug therapy: Secondary | ICD-10-CM | POA: Insufficient documentation

## 2015-03-13 DIAGNOSIS — E119 Type 2 diabetes mellitus without complications: Secondary | ICD-10-CM | POA: Insufficient documentation

## 2015-03-13 DIAGNOSIS — Z720 Tobacco use: Secondary | ICD-10-CM

## 2015-03-13 DIAGNOSIS — I10 Essential (primary) hypertension: Secondary | ICD-10-CM | POA: Insufficient documentation

## 2015-03-13 DIAGNOSIS — F17200 Nicotine dependence, unspecified, uncomplicated: Secondary | ICD-10-CM

## 2015-03-13 DIAGNOSIS — E669 Obesity, unspecified: Secondary | ICD-10-CM | POA: Insufficient documentation

## 2015-03-13 DIAGNOSIS — E785 Hyperlipidemia, unspecified: Secondary | ICD-10-CM | POA: Insufficient documentation

## 2015-03-13 DIAGNOSIS — F17210 Nicotine dependence, cigarettes, uncomplicated: Secondary | ICD-10-CM | POA: Insufficient documentation

## 2015-03-13 DIAGNOSIS — E662 Morbid (severe) obesity with alveolar hypoventilation: Secondary | ICD-10-CM

## 2015-03-13 DIAGNOSIS — I517 Cardiomegaly: Secondary | ICD-10-CM | POA: Insufficient documentation

## 2015-03-13 LAB — MICROALBUMIN, URINE: MICROALB UR: 8.8 mg/dL — AB (ref <2.0)

## 2015-03-13 MED ORDER — NICOTINE 21 MG/24HR TD PT24: 21.0000 mg | MEDICATED_PATCH | Freq: Every day | TRANSDERMAL | Status: DC

## 2015-03-13 MED ORDER — NICOTINE 7 MG/24HR TD PT24: 7.0000 mg | MEDICATED_PATCH | Freq: Every day | TRANSDERMAL | Status: DC

## 2015-03-13 MED ORDER — NICOTINE 14 MG/24HR TD PT24: 14.0000 mg | MEDICATED_PATCH | Freq: Every day | TRANSDERMAL | Status: DC

## 2015-03-13 NOTE — Progress Notes (Signed)
Patient was referred by the ED physician as well as Vikki PortsValerie for cardiomegaly.   Patient denies any pain today including chest pain. Patient reports he experiences SOB often. Patient reports if he walks too far or too much he gets SOB. Patient reports wheezing sometime, but not often. Patient denies any edema.  Patient has not been taking lipitor for about a month due to being out of it. Patient has refills on prescriptions to be picked up today. Patient has not taken any of his medications today.   Patient smokes between 1.5 - 2 packs of cigarettes a day.

## 2015-03-13 NOTE — Assessment & Plan Note (Signed)
This is multifactorial. He has significant pulmonary disease from smoking and now sleep apnea with obesity. He also has hypertension and diabetes is poorly controlled with hemoglobin A1c above 9.  I've advised him to quit smoking. We have prescribed a nicotine patch per his request with a 3 month taper. I've encouraged him to walk on a daily basis. His wife is very supportive and does most of the cooking. I have advised a low carb diet and reduce calories. I've also asked him to avoid salty foods and eating out. He states that he will get a sleep study. I've advised to be compliant everyday with his medications. I will see him back in 3 months for follow-up and accountability. I will not order an echocardiogram because it will not change my diagnosis or my treatment.

## 2015-03-13 NOTE — Progress Notes (Signed)
HPI Keith Liu is a 39 year old married African-American male referred by Chari Manning for the evaluation of mild cardiomegaly a and multiple risk factors for congestive heart failure and coronary artery disease.  He has history of hypertension, heavy tobacco use of 2 packs per day, obesity, sedentary lifestyle, and hyperlipidemia. He went to the emergency room for neck pain and chest x-ray showed mild cardiomegaly.  EKG shows evidence of pulmonary disease. His wife says that he has apnea but has not had the money to get a sleep study.  He denies orthopnea PND but has some mild edema. He denies any chest pain, palpitations, syncope or presyncope.  Past Medical History  Diagnosis Date  . Hypertension   . Diabetes mellitus without complication     Current Outpatient Prescriptions  Medication Sig Dispense Refill  . albuterol (PROVENTIL HFA;VENTOLIN HFA) 108 (90 BASE) MCG/ACT inhaler Inhale 2 puffs into the lungs every 6 (six) hours as needed for wheezing or shortness of breath. 1 Inhaler 0  . amLODipine (NORVASC) 10 MG tablet Take 1 tablet (10 mg total) by mouth daily. 30 tablet 4  . diazepam (VALIUM) 5 MG tablet Take 1 tablet (5 mg total) by mouth every 8 (eight) hours as needed for muscle spasms. 20 tablet 0  . glipiZIDE (GLUCOTROL) 10 MG tablet Take 1 tablet (10 mg total) by mouth 2 (two) times daily before a meal. 60 tablet 3  . glucose blood test strip Use as instructed 100 each 12  . glucose monitoring kit (FREESTYLE) monitoring kit 1 each by Does not apply route as needed. 1 each 0  . hydrochlorothiazide (HYDRODIURIL) 25 MG tablet Take 1 tablet (25 mg total) by mouth daily. 30 tablet 3  . Lancets (FREESTYLE) lancets Use as instructed 100 each 12  . metFORMIN (GLUCOPHAGE) 500 MG tablet Take 1 tablet (500 mg total) by mouth 2 (two) times daily with a meal. 180 tablet 3  . atorvastatin (LIPITOR) 10 MG tablet Take 1 tablet (10 mg total) by mouth daily. (Patient not taking: Reported on  03/13/2015) 90 tablet 3  . ibuprofen (ADVIL,MOTRIN) 800 MG tablet Take 1,600 mg by mouth every 8 (eight) hours as needed for mild pain.    Marland Kitchen ibuprofen (ADVIL,MOTRIN) 800 MG tablet Take 1 tablet (800 mg total) by mouth every 8 (eight) hours as needed. (Patient not taking: Reported on 03/12/2015) 30 tablet 0  . [DISCONTINUED] lisinopril (PRINIVIL,ZESTRIL) 20 MG tablet Take 1 tablet (20 mg total) by mouth daily. 30 tablet 1   No current facility-administered medications for this visit.    Allergies  Allergen Reactions  . Lisinopril Swelling    Angioedema of lower lip    Family History  Problem Relation Age of Onset  . COPD Mother   . Diabetes Mother   . Hypertension Mother     History   Social History  . Marital Status: Single    Spouse Name: N/A  . Number of Children: N/A  . Years of Education: N/A   Occupational History  . Not on file.   Social History Main Topics  . Smoking status: Heavy Tobacco Smoker -- 1.50 packs/day    Types: Cigarettes  . Smokeless tobacco: Not on file  . Alcohol Use: 0.0 oz/week    0 Standard drinks or equivalent per week     Comment: occasional 03/13/15   . Drug Use: No  . Sexual Activity: Yes   Other Topics Concern  . Not on file   Social History Narrative  ROS ALL NEGATIVE EXCEPT THOSE NOTED IN HPI  PE  General Appearance: well developed, well nourished in no acute distress, muscular and obese HEENT: symmetrical face, PERRLA, good dentition  Neck: no JVD, thyromegaly, or adenopathy, trachea midline Chest: symmetric without deformity Cardiac: PMI non-displaced, RRR, normal S1, S2, no gallop or murmur Lung: clear to ausculation and percussion Vascular: all pulses full without bruits  Abdominal: nondistended, nontender, good bowel sounds, no HSM, no bruits Extremities: no cyanosis, clubbing, 1+ ankle edema, no sign of DVT, no varicosities  Skin: normal color, no rashes Neuro: alert and oriented x 3, non-focal Pysch: normal  affect  EKG  BMET    Component Value Date/Time   NA 138 03/01/2015 1046   K 4.0 03/01/2015 1046   CL 101 03/01/2015 1046   CO2 26 03/01/2015 1046   GLUCOSE 281* 03/01/2015 1046   BUN 8 03/01/2015 1046   CREATININE 0.76 03/01/2015 1046   CREATININE 0.84 08/24/2014 0934   CALCIUM 8.8* 03/01/2015 1046   GFRNONAA >60 03/01/2015 1046   GFRNONAA >89 08/24/2014 0934   GFRAA >60 03/01/2015 1046   GFRAA >89 08/24/2014 0934    Lipid Panel     Component Value Date/Time   CHOL 177 08/24/2014 0934   TRIG 192* 08/24/2014 0934   HDL 43 08/24/2014 0934   CHOLHDL 4.1 08/24/2014 0934   VLDL 38 08/24/2014 0934   LDLCALC 96 08/24/2014 0934    CBC    Component Value Date/Time   WBC 7.7 03/01/2015 1046   RBC 6.18* 03/01/2015 1046   HGB 16.1 03/01/2015 1046   HCT 50.1 03/01/2015 1046   PLT 160 03/01/2015 1046   MCV 80.9 03/01/2015 1046   MCH 26.0 03/01/2015 1046   MCHC 32.1 03/01/2015 1046   RDW 15.5* 03/01/2015 1046

## 2015-03-13 NOTE — Patient Instructions (Signed)
Thank you for coming in today. Please return in 3 months.

## 2015-03-13 NOTE — Assessment & Plan Note (Signed)
Advised to quit. Nicotine patch given

## 2015-03-15 ENCOUNTER — Telehealth: Payer: Self-pay | Admitting: *Deleted

## 2015-03-15 NOTE — Telephone Encounter (Signed)
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 I am unable to place him on further medication to help with kidney function due to allergy to lisinopril. Please confirm what allergy he had to medication please. Thanks

## 2015-03-15 NOTE — Telephone Encounter (Signed)
Left HIPAA compliant message for patient to return my call. 

## 2015-04-09 ENCOUNTER — Ambulatory Visit: Payer: Self-pay | Attending: Internal Medicine | Admitting: Pharmacist

## 2015-04-09 DIAGNOSIS — E119 Type 2 diabetes mellitus without complications: Secondary | ICD-10-CM | POA: Insufficient documentation

## 2015-04-09 LAB — GLUCOSE, POCT (MANUAL RESULT ENTRY): POC GLUCOSE: 172 mg/dL — AB (ref 70–99)

## 2015-04-09 NOTE — Progress Notes (Signed)
S:    Patient arrives in good spirits.  Presents for diabetes follow up.  Patient reports adherence with medications. Current diabetes medications include metformin 500 mg BID and glipizide 10 mg BID.  Patient denies hypoglycemic events.  Patient brings with him his blood glucose log. He has been unable to check his glucose the last few days as he has been out of strips.   Patient reports that he has been sick the last few days - likely a cold. He has been taking Theraflu and is starting to feel better.    O:  . Lab Results  Component Value Date   HGBA1C 9.80 03/12/2015    In Clinic Glucose = 172 (fasting)  Home fasting CBG: <150 2 hour post-prandial/random CBG: 140s-150s.  A/P: Diabetes currently uncontrolled based on A1c of 9.8 but under improved control based on home glucose readings. Today's reading of 172 likely elevated due to infection/illness.  Denies hypoglycemic events and is able to verbalize appropriate hypoglycemia management plan.  Reports adherence with medication. Control is suboptimal due to dietary indiscretion and lack of physical activity. Continue current medications. Encouraged patient to obtain post-prandial readings and then schedule an appointment with the nurse if he sees multiple readings >180 mg/dl. Next A1C anticipated October 2016.  Written patient instructions provided.  Follow up with [facility] as directed. Total time in face to face counseling 20 minutes.  Patient seen with Hazle Nordmann, PharmD Resident.

## 2015-04-09 NOTE — Patient Instructions (Signed)
It was great to see you today!  Great job with your blood sugar - keep up the great work!  Try taking some blood sugars 2 hours after you eat or before lunch and dinner.

## 2015-05-16 ENCOUNTER — Other Ambulatory Visit: Payer: Self-pay | Admitting: Internal Medicine

## 2015-07-29 ENCOUNTER — Ambulatory Visit: Payer: Self-pay | Admitting: Internal Medicine

## 2015-08-17 ENCOUNTER — Emergency Department
Admission: EM | Admit: 2015-08-17 | Discharge: 2015-08-17 | Disposition: A | Discharge status: Home / Self Care | Payer: Self-pay | Attending: Emergency Medicine | Admitting: Emergency Medicine

## 2015-08-17 ENCOUNTER — Encounter: Payer: Self-pay | Admitting: Emergency Medicine

## 2015-08-17 DIAGNOSIS — F17210 Nicotine dependence, cigarettes, uncomplicated: Secondary | ICD-10-CM | POA: Insufficient documentation

## 2015-08-17 DIAGNOSIS — I10 Essential (primary) hypertension: Secondary | ICD-10-CM | POA: Insufficient documentation

## 2015-08-17 DIAGNOSIS — E119 Type 2 diabetes mellitus without complications: Secondary | ICD-10-CM | POA: Insufficient documentation

## 2015-08-17 DIAGNOSIS — Z79899 Other long term (current) drug therapy: Secondary | ICD-10-CM | POA: Insufficient documentation

## 2015-08-17 DIAGNOSIS — B353 Tinea pedis: Secondary | ICD-10-CM | POA: Insufficient documentation

## 2015-08-17 DIAGNOSIS — Z7984 Long term (current) use of oral hypoglycemic drugs: Secondary | ICD-10-CM | POA: Insufficient documentation

## 2015-08-17 DIAGNOSIS — L03115 Cellulitis of right lower limb: Secondary | ICD-10-CM | POA: Insufficient documentation

## 2015-08-17 LAB — BASIC METABOLIC PANEL
Anion gap: 8 (ref 5–15)
BUN: 7 mg/dL (ref 6–20)
CO2: 30 mmol/L (ref 22–32)
CREATININE: 0.79 mg/dL (ref 0.61–1.24)
Calcium: 9.3 mg/dL (ref 8.9–10.3)
Chloride: 99 mmol/L — ABNORMAL LOW (ref 101–111)
Glucose, Bld: 264 mg/dL — ABNORMAL HIGH (ref 65–99)
POTASSIUM: 3.4 mmol/L — AB (ref 3.5–5.1)
SODIUM: 137 mmol/L (ref 135–145)

## 2015-08-17 LAB — CBC WITH DIFFERENTIAL/PLATELET
BASOS PCT: 1 %
Basophils Absolute: 0.1 10*3/uL (ref 0–0.1)
EOS ABS: 0.4 10*3/uL (ref 0–0.7)
Eosinophils Relative: 5 %
HCT: 52.4 % — ABNORMAL HIGH (ref 40.0–52.0)
Hemoglobin: 16.9 g/dL (ref 13.0–18.0)
Lymphocytes Relative: 34 %
Lymphs Abs: 3.2 10*3/uL (ref 1.0–3.6)
MCH: 25.9 pg — ABNORMAL LOW (ref 26.0–34.0)
MCHC: 32.2 g/dL (ref 32.0–36.0)
MCV: 80.5 fL (ref 80.0–100.0)
Monocytes Absolute: 0.7 10*3/uL (ref 0.2–1.0)
Monocytes Relative: 7 %
Neutro Abs: 5.1 10*3/uL (ref 1.4–6.5)
Neutrophils Relative %: 53 %
Platelets: 174 10*3/uL (ref 150–440)
RBC: 6.51 MIL/uL — AB (ref 4.40–5.90)
RDW: 15.3 % — ABNORMAL HIGH (ref 11.5–14.5)
WBC: 9.5 10*3/uL (ref 3.8–10.6)

## 2015-08-17 MED ORDER — CLINDAMYCIN HCL 300 MG PO CAPS
300.0000 mg | ORAL_CAPSULE | Freq: Four times a day (QID) | ORAL | Status: DC
Start: 2015-08-17 — End: 2016-03-25

## 2015-08-17 MED ORDER — TRAMADOL HCL 50 MG PO TABS: 50.0000 mg | ORAL_TABLET | Freq: Three times a day (TID) | ORAL | Status: DC | PRN

## 2015-08-17 MED ORDER — KETOCONAZOLE 2 % EX CREA: 1.0000 "application " | TOPICAL_CREAM | Freq: Two times a day (BID) | CUTANEOUS | Status: DC

## 2015-08-17 MED ORDER — CLINDAMYCIN PHOSPHATE 600 MG/50ML IV SOLN
600.0000 mg | Freq: Once | INTRAVENOUS | Status: AC
  Administered 2015-08-17: 600 mg via INTRAVENOUS
  Filled 2015-08-17: qty 50

## 2015-08-17 MED ORDER — KETOROLAC TROMETHAMINE 10 MG PO TABS: 10.0000 mg | ORAL_TABLET | Freq: Three times a day (TID) | ORAL | Status: DC

## 2015-08-17 MED ORDER — KETOROLAC TROMETHAMINE 30 MG/ML IJ SOLN
30.0000 mg | Freq: Once | INTRAMUSCULAR | Status: AC
  Administered 2015-08-17: 30 mg via INTRAVENOUS
  Filled 2015-08-17: qty 1

## 2015-08-17 NOTE — ED Notes (Signed)
Pt discharged home after verbalizing understanding of discharge instructions; nad noted. 

## 2015-08-17 NOTE — ED Notes (Signed)
C/o right foot pain.  Reports top of foot is better with cream but bottom hurts.  Denies fevers.

## 2015-08-17 NOTE — ED Provider Notes (Signed)
[facility] Emergency Department Provider Note ____________________________________________  Time seen: 1935  I have reviewed the triage vital signs and the nursing notes.  HISTORY  Chief Complaint  Foot Pain  HPI Keith Liu is a 39 y.o. male reports to the ED for evaluation of right foot pain, swelling, and redness. Patient reports that he's been having pain to the bottom of the foot and he notes a small pus-filled callous over the ball of the middle toe. He also notes some skin changes to the toes due to his known foot fungus. He describes that the skin actually somewhat improved she can apply the cream to the toes. He denies any fevers, chills, or sweats. He does note pain about the foot does not allow him to step through it and push off with a normal walking gait. He denies any injury to the foot or toes, denies any attempts to drain any pus or shave any calluses. He reports pain to the foot at a 7/10 in triage.  Past Medical History  Diagnosis Date  . Hypertension   . Diabetes mellitus without complication [facility])     Patient Active Problem List   Diagnosis Date Noted  . Obesity hypoventilation syndrome ([place]) 03/13/2015  . Cardiomegaly 03/13/2015  . Essential hypertension 11/06/2014  . SOB (shortness of breath) 11/06/2014  . DM type 2 (diabetes mellitus, type 2) ([place]) 09/21/2014  . HLD (hyperlipidemia) 09/21/2014  . Smoking 09/21/2014    Past Surgical History  Procedure Laterality Date  . Knee surgery Left     Current Outpatient Rx  Name  Route  Sig  Dispense  Refill  . albuterol (PROVENTIL HFA;VENTOLIN HFA) 108 (90 BASE) MCG/ACT inhaler   Inhalation   Inhale 2 puffs into the lungs every 6 (six) hours as needed for wheezing or shortness of breath.   1 Inhaler   0   . amLODipine (NORVASC) 10 MG tablet      TAKE ONE TABLET BY MOUTH DAILY.   30 tablet   4   . atorvastatin (LIPITOR) 10 MG tablet   Oral   Take 1 tablet (10 mg total) by  mouth daily.   90 tablet   3   . clindamycin (CLEOCIN) 300 MG capsule   Oral   Take 1 capsule (300 mg total) by mouth 4 (four) times daily.   40 capsule   0   . diazepam (VALIUM) 5 MG tablet   Oral   Take 1 tablet (5 mg total) by mouth every 8 (eight) hours as needed for muscle spasms. Patient not taking: Reported on 04/09/2015   20 tablet   0   . glipiZIDE (GLUCOTROL) 10 MG tablet   Oral   Take 1 tablet (10 mg total) by mouth 2 (two) times daily before a meal.   60 tablet   3   . glucose blood test strip      Use as instructed   100 each   12   . glucose monitoring kit (FREESTYLE) monitoring kit   Does not apply   1 each by Does not apply route as needed.   1 each   0   . hydrochlorothiazide (HYDRODIURIL) 25 MG tablet   Oral   Take 1 tablet (25 mg total) by mouth daily.   30 tablet   3   . hydrochlorothiazide (HYDRODIURIL) 25 MG tablet      TAKE 1 TABLET BY MOUTH DAILY.   30 tablet   3   .  ibuprofen (ADVIL,MOTRIN) 800 MG tablet   Oral   Take 1,600 mg by mouth every 8 (eight) hours as needed for mild pain.         Marland Kitchen ibuprofen (ADVIL,MOTRIN) 800 MG tablet   Oral   Take 1 tablet (800 mg total) by mouth every 8 (eight) hours as needed. Patient not taking: Reported on 03/12/2015   30 tablet   0   . ketoconazole (NIZORAL) 2 % cream   Topical   Apply 1 application topically 2 (two) times daily.   30 g   0   . ketorolac (TORADOL) 10 MG tablet   Oral   Take 1 tablet (10 mg total) by mouth every 8 (eight) hours.   15 tablet   0   . Lancets (FREESTYLE) lancets      Use as instructed   100 each   12   . metFORMIN (GLUCOPHAGE) 500 MG tablet   Oral   Take 1 tablet (500 mg total) by mouth 2 (two) times daily with a meal.   180 tablet   3   . nicotine (NICODERM CQ - DOSED IN MG/24 HOURS) 14 mg/24hr patch   Transdermal   Place 1 patch (14 mg total) onto the skin daily. Patient not taking: Reported on 04/09/2015   28 patch   0   . nicotine (NICODERM  CQ - DOSED IN MG/24 HOURS) 21 mg/24hr patch   Transdermal   Place 1 patch (21 mg total) onto the skin daily. Patient not taking: Reported on 04/09/2015   28 patch   0   . nicotine (NICODERM CQ - DOSED IN MG/24 HR) 7 mg/24hr patch   Transdermal   Place 1 patch (7 mg total) onto the skin daily. Patient not taking: Reported on 04/09/2015   28 patch   0   . traMADol (ULTRAM) 50 MG tablet   Oral   Take 1 tablet (50 mg total) by mouth 3 (three) times daily as needed.   15 tablet   0    Allergies Lisinopril  Family History  Problem Relation Age of Onset  . COPD Mother   . Diabetes Mother   . Hypertension Mother    Social History Social History  Substance Use Topics  . Smoking status: Heavy Tobacco Smoker -- 1.50 packs/day    Types: Cigarettes  . Smokeless tobacco: None  . Alcohol Use: 0.0 oz/week    0 Standard drinks or equivalent per week     Comment: occasional 03/13/15    Review of Systems  Constitutional: Negative for fever. Eyes: Negative for visual changes. ENT: Negative for sore throat. Cardiovascular: Negative for chest pain. Respiratory: Negative for shortness of breath. Gastrointestinal: Negative for abdominal pain, vomiting and diarrhea. Genitourinary: Negative for dysuria. Musculoskeletal: Negative for back pain. Skin: Negative for rash. Red, swollen right foot as above.  Neurological: Negative for headaches, focal weakness or numbness. ____________________________________________  PHYSICAL EXAM:  VITAL SIGNS: ED Triage Vitals  Enc Vitals Group     BP 08/17/15 1810 172/94 mmHg     Pulse Rate 08/17/15 1810 103     Resp 08/17/15 1810 18     Temp 08/17/15 1810 97.9 F (36.6 C)     Temp Source 08/17/15 1810 Oral     SpO2 08/17/15 1810 99 %     Weight 08/17/15 1810 230 lb (104.327 kg)     Height 08/17/15 1810 _0  (1.676 m)     Head Cir --  Peak Flow --      Pain Score 08/17/15 1816 7     Pain Loc --      Pain Edu? --      Excl. in [place]? --     Constitutional: Alert and oriented. Well appearing and in no distress. Head: Normocephalic and atraumatic.      Eyes: Conjunctivae are normal. PERRL. Normal extraocular movements      Ears: Canals clear. TMs intact bilaterally.   Nose: No congestion/rhinorrhea.   Mouth/Throat: Mucous membranes are moist.   Neck: Supple. No thyromegaly. Hematological/Lymphatic/Immunological: No cervical lymphadenopathy. Cardiovascular: Normal rate, regular rhythm.  Respiratory: Normal respiratory effort. No wheezes/rales/rhonchi. Gastrointestinal: Soft and nontender. No distention. Musculoskeletal: Nontender with normal range of motion in all extremities.  Neurologic:  Normal gait without ataxia. Normal speech and language. No gross focal neurologic deficits are appreciated. Skin:  Skin is warm, dry and intact. No rash noted. The right foot is noted to have some superficial soft tissue swelling to the dorsal aspect from the toes to the ankle. There is also some early indication of phlebitis appreciated. There is some minimal 1+ pitting edema to the lower anterior shin on the right. There is macerated skin to the web space on the right buttock consistent with a foot fungus. There is also superficial hyper pigmented skin consistent as well with tinea pedis. On the plantar aspect of the foot there is a single callus to the MCP of the third toe. It is tender to palpation and has some appearance of superficial infection. There is no obvious puncture or plantar foot wound appreciated. Psychiatric: Mood and affect are normal. Patient exhibits appropriate insight and judgment. ____________________________________________   LABS (pertinent positives/negatives) Labs Reviewed  CBC WITH DIFFERENTIAL/PLATELET - Abnormal; Notable for the following:    RBC 6.51 (*)    HCT 52.4 (*)    MCH 25.9 (*)    RDW 15.3 (*)    All other components within normal limits  BASIC METABOLIC PANEL - Abnormal; Notable for the  following:    Potassium 3.4 (*)    Chloride 99 (*)    Glucose, Bld 264 (*)    All other components within normal limits  ____________________________________________  PROCEDURES  Toradol 30 mg IVP Clindamycin 600 mg IVP ____________________________________________  INITIAL IMPRESSION / ASSESSMENT AND PLAN / ED COURSE  Patient with an acute cellulitis to the right foot and skin changes consistent with tinea pedis. He will be discharged with prescriptions for clindamycin to dose as directed. He is also provided with a prescription for ketoconazole cream, ketorolac, and tramadol to dose for pain. He will follow with his primary care provider in one week for wound check. Return to the ED for acutely worsening signs of infection as discussed. ____________________________________________  FINAL CLINICAL IMPRESSION(S) / ED DIAGNOSES  Final diagnoses:  Cellulitis of right lower extremity  Tinea pedis of right foot      Melvenia Needles, PA-C 08/18/15 0020

## 2015-08-17 NOTE — Discharge Instructions (Signed)
Athlete's Foot Athlete's foot (tinea pedis) is a fungal infection of the skin on the feet. It often occurs on the skin between the toes or underneath the toes. It can also occur on the soles of the feet. Athlete's foot is more likely to occur in hot, humid weather. Not washing your feet or changing your socks often enough can contribute to athlete's foot. The infection can spread from person to person (contagious). CAUSES Athlete's foot is caused by a fungus. This fungus thrives in warm, moist places. Most people get athlete's foot by sharing shower stalls, towels, and wet floors with an infected person. People with weakened immune systems, including those with diabetes, may be more likely to get athlete's foot. SYMPTOMS   Itchy areas between the toes or on the soles of the feet.  White, flaky, or scaly areas between the toes or on the soles of the feet.  Tiny, intensely itchy blisters between the toes or on the soles of the feet.  Tiny cuts on the skin. These cuts can develop a bacterial infection.  Thick or discolored toenails. DIAGNOSIS  Your caregiver can usually tell what the problem is by doing a physical exam. Your caregiver may also take a skin sample from the rash area. The skin sample may be examined under a microscope, or it may be tested to see if fungus will grow in the sample. A sample may also be taken from your toenail for testing. TREATMENT  Over-the-counter and prescription medicines can be used to kill the fungus. These medicines are available as powders or creams. Your caregiver can suggest medicines for you. Fungal infections respond slowly to treatment. You may need to continue using your medicine for several weeks. PREVENTION   Do not share towels.  Wear sandals in wet areas, such as shared locker rooms and shared showers.  Keep your feet dry. Wear shoes that allow air to circulate. Wear cotton or wool socks. HOME CARE INSTRUCTIONS   Take medicines as directed by  your caregiver. Do not use steroid creams on athlete's foot.  Keep your feet clean and cool. Wash your feet daily and dry them thoroughly, especially between your toes.  Change your socks every day. Wear cotton or wool socks. In hot climates, you may need to change your socks 2 to 3 times per day.  Wear sandals or canvas tennis shoes with good air circulation.  If you have blisters, soak your feet in Burow's solution or Epsom salts for 20 to 30 minutes, 2 times a day to dry out the blisters. Make sure you dry your feet thoroughly afterward. SEEK MEDICAL CARE IF:   You have a fever.  You have swelling, soreness, warmth, or redness in your foot.  You are not getting better after 7 days of treatment.  You are not completely cured after 30 days.  You have any problems caused by your medicines. MAKE SURE YOU:   Understand these instructions.  Will watch your condition.  Will get help right away if you are not doing well or get worse.   This information is not intended to replace advice given to you by your health care provider. Make sure you discuss any questions you have with your health care provider.   Document Released: 08/21/2000 Document Revised: 11/16/2011 Document Reviewed: 02/25/2015 Elsevier Interactive Patient Education 2016 [employer]  Cellulitis Cellulitis is an infection of the skin and the tissue beneath it. The infected area is usually red and tender. Cellulitis occurs most often in  the arms and lower legs.  CAUSES  Cellulitis is caused by bacteria that enter the skin through cracks or cuts in the skin. The most common types of bacteria that cause cellulitis are staphylococci and streptococci. SIGNS AND SYMPTOMS   Redness and warmth.  Swelling.  Tenderness or pain.  Fever. DIAGNOSIS  Your health care provider can usually determine what is wrong based on a physical exam. Blood tests may also be done. TREATMENT  Treatment usually involves taking an  antibiotic medicine. HOME CARE INSTRUCTIONS   Take your antibiotic medicine as directed by your health care provider. Finish the antibiotic even if you start to feel better.  Keep the infected arm or leg elevated to reduce swelling.  Apply a warm cloth to the affected area up to 4 times per day to relieve pain.  Take medicines only as directed by your health care provider.  Keep all follow-up visits as directed by your health care provider. SEEK MEDICAL CARE IF:   You notice red streaks coming from the infected area.  Your red area gets larger or turns dark in color.  Your bone or joint underneath the infected area becomes painful after the skin has healed.  Your infection returns in the same area or another area.  You notice a swollen bump in the infected area.  You develop new symptoms.  You have a fever. SEEK IMMEDIATE MEDICAL CARE IF:   You feel very sleepy.  You develop vomiting or diarrhea.  You have a general ill feeling (malaise) with muscle aches and pains.   This information is not intended to replace advice given to you by your health care provider. Make sure you discuss any questions you have with your health care provider.   Document Released: 06/03/2005 Document Revised: 05/15/2015 Document Reviewed: 11/09/2011 Elsevier Interactive Patient Education 2016 [employer].   Take the prescription meds as directed. Rest with the foot elevated as needed.  Follow-up with your provider in 1 week for recheck.

## 2015-08-29 ENCOUNTER — Ambulatory Visit: Payer: Self-pay | Admitting: Internal Medicine

## 2015-09-04 ENCOUNTER — Other Ambulatory Visit: Payer: Self-pay | Admitting: Pharmacist

## 2015-09-04 MED ORDER — GLUCOSE BLOOD VI STRP: ORAL_STRIP | Status: DC

## 2015-09-04 MED ORDER — TRUE METRIX METER W/DEVICE KIT: PACK | Status: DC

## 2015-09-19 MED FILL — ?METFORMIN HCL 500MG TABLET: 500 | 30 days supply | Qty: 60 | Fill #2

## 2015-09-19 MED FILL — !TRUE METRIX BLOOD GLUCOSE: 365 days supply | Qty: 1 | Fill #0

## 2015-09-19 MED FILL — TRUE METRIX TEST STRIP: 25 days supply | Qty: 100 | Fill #0

## 2015-10-09 MED FILL — ?GLIPIZIDE 10 MG TABLET: 10 | 30 days supply | Qty: 60 | Fill #3

## 2015-10-09 MED FILL — HYDROCHLOROTHIAZIDE 25 MG T: 25 | 30 days supply | Qty: 30 | Fill #0

## 2015-10-09 MED FILL — ?AMLODIPINE BESYLATE 10 MG: 10 | 30 days supply | Qty: 30 | Fill #4

## 2015-10-09 MED FILL — ATORVASTATIN 10 MG TABLET: 10 | 30 days supply | Qty: 30 | Fill #6

## 2015-11-11 ENCOUNTER — Other Ambulatory Visit: Payer: Self-pay | Admitting: Internal Medicine

## 2015-11-11 MED FILL — ?AMLODIPINE BESYLATE 10 MG: 10 | 30 days supply | Qty: 30 | Fill #0

## 2015-11-11 MED FILL — HYDROCHLOROTHIAZIDE 25 MG T: 25 | 30 days supply | Qty: 30 | Fill #1

## 2015-11-11 MED FILL — ATORVASTATIN 10 MG TABLET: 10 | 30 days supply | Qty: 30 | Fill #7

## 2015-11-11 MED FILL — ?METFORMIN HCL 500MG TABLET: 500 | 30 days supply | Qty: 60 | Fill #3

## 2015-11-13 ENCOUNTER — Other Ambulatory Visit: Payer: Self-pay | Admitting: Internal Medicine

## 2015-11-15 ENCOUNTER — Other Ambulatory Visit: Payer: Self-pay | Admitting: Internal Medicine

## 2015-11-15 MED FILL — glipiZIDE 10 MG TABS: 10 | 30 days supply | Qty: 60 | Fill #0

## 2015-12-06 ENCOUNTER — Ambulatory Visit: Payer: Self-pay | Admitting: Internal Medicine

## 2015-12-16 ENCOUNTER — Other Ambulatory Visit: Payer: Self-pay | Admitting: Internal Medicine

## 2015-12-16 MED FILL — AMLODIPINE BESYLATE 10 MG T: 10 | 30 days supply | Qty: 30 | Fill #1

## 2015-12-16 MED FILL — ATORVASTATIN 10 MG TABLET: 10 | 30 days supply | Qty: 30 | Fill #8

## 2015-12-16 MED FILL — HYDROCHLOROTHIAZIDE 25 MG T: 25 | 30 days supply | Qty: 30 | Fill #2

## 2015-12-23 ENCOUNTER — Other Ambulatory Visit: Payer: Self-pay | Admitting: Internal Medicine

## 2015-12-23 MED FILL — ?METFORMIN HCL 500MG TABLET: 500 | 30 days supply | Qty: 60 | Fill #4

## 2015-12-23 MED FILL — !VENTOLIN HFA INHALER: 108 (90 BAS | 30 days supply | Qty: 18 | Fill #0

## 2015-12-30 ENCOUNTER — Other Ambulatory Visit: Payer: Self-pay | Admitting: Internal Medicine

## 2015-12-31 ENCOUNTER — Other Ambulatory Visit: Payer: Self-pay | Admitting: Internal Medicine

## 2015-12-31 NOTE — Telephone Encounter (Signed)
Pt. Called requesting a refill on glipiZIDE (GLUCOTROL) 10 MG tablet, Pt. Was advised that he needs an OV for a refill. Please f/u with pt.

## 2016-01-29 MED FILL — AMLODIPINE BESYLATE 10 MG T: 10 | 30 days supply | Qty: 30 | Fill #2

## 2016-01-29 MED FILL — HYDROCHLOROTHIAZIDE 25 MG T: 25 | 30 days supply | Qty: 30 | Fill #3

## 2016-01-29 MED FILL — ATORVASTATIN 10 MG TABLET: 10 | 30 days supply | Qty: 30 | Fill #9

## 2016-03-11 MED FILL — ?AMLODIPINE BESYLATE 10 MG: 10 | 30 days supply | Qty: 30 | Fill #3

## 2016-03-11 MED FILL — ?ATORVASTATIN 10 MG TABLET: 10 | 30 days supply | Qty: 30 | Fill #10

## 2016-03-11 MED FILL — HYDROCHLOROTHIAZIDE 25 MG T: 25 | 30 days supply | Qty: 30 | Fill #4

## 2016-03-25 ENCOUNTER — Ambulatory Visit: Payer: Self-pay | Attending: Internal Medicine | Admitting: Internal Medicine

## 2016-03-25 ENCOUNTER — Encounter: Payer: Self-pay | Admitting: Internal Medicine

## 2016-03-25 VITALS — BP 108/72 | HR 91 | Temp 98.3°F | Wt 220.4 lb

## 2016-03-25 DIAGNOSIS — E785 Hyperlipidemia, unspecified: Secondary | ICD-10-CM

## 2016-03-25 DIAGNOSIS — IMO0001 Reserved for inherently not codable concepts without codable children: Secondary | ICD-10-CM

## 2016-03-25 DIAGNOSIS — F17200 Nicotine dependence, unspecified, uncomplicated: Secondary | ICD-10-CM

## 2016-03-25 DIAGNOSIS — Z720 Tobacco use: Secondary | ICD-10-CM

## 2016-03-25 DIAGNOSIS — E119 Type 2 diabetes mellitus without complications: Secondary | ICD-10-CM

## 2016-03-25 DIAGNOSIS — I10 Essential (primary) hypertension: Secondary | ICD-10-CM

## 2016-03-25 LAB — POCT GLYCOSYLATED HEMOGLOBIN (HGB A1C): HEMOGLOBIN A1C: 10

## 2016-03-25 LAB — GLUCOSE, POCT (MANUAL RESULT ENTRY): POC Glucose: 203 mg/dl — AB (ref 70–99)

## 2016-03-25 MED ORDER — BUPROPION HCL ER (SR) 150 MG PO TB12: 150.0000 mg | ORAL_TABLET | Freq: Two times a day (BID) | ORAL | Status: DC

## 2016-03-25 MED ORDER — AMLODIPINE BESYLATE 10 MG PO TABS: 10.0000 mg | ORAL_TABLET | Freq: Every day | ORAL | Status: DC

## 2016-03-25 MED ORDER — TRUEPLUS LANCETS 26G MISC: 1.0000 | Freq: Three times a day (TID) | Status: DC | PRN

## 2016-03-25 MED ORDER — INSULIN PEN NEEDLE 32G X 4 MM MISC: 1.0000 | Freq: Every day | Status: DC

## 2016-03-25 MED ORDER — HYDROCHLOROTHIAZIDE 25 MG PO TABS: 25.0000 mg | ORAL_TABLET | Freq: Every day | ORAL | Status: DC

## 2016-03-25 MED ORDER — INSULIN GLARGINE 100 UNIT/ML SOLOSTAR PEN: 15.0000 [IU] | PEN_INJECTOR | Freq: Every day | SUBCUTANEOUS | Status: DC

## 2016-03-25 MED ORDER — ATORVASTATIN CALCIUM 10 MG PO TABS: 10.0000 mg | ORAL_TABLET | Freq: Every day | ORAL | Status: DC

## 2016-03-25 MED ORDER — GLIPIZIDE 10 MG PO TABS: 10.0000 mg | ORAL_TABLET | Freq: Two times a day (BID) | ORAL | Status: DC

## 2016-03-25 MED ORDER — ALBUTEROL SULFATE HFA 108 (90 BASE) MCG/ACT IN AERS: INHALATION_SPRAY | RESPIRATORY_TRACT | Status: DC

## 2016-03-25 MED ORDER — METFORMIN HCL 500 MG PO TABS: 500.0000 mg | ORAL_TABLET | Freq: Two times a day (BID) | ORAL | Status: DC

## 2016-03-25 MED ORDER — GLUCOSE BLOOD VI STRP: ORAL_STRIP | Status: DC

## 2016-03-25 MED FILL — TRUEplus LANCETS 28G MISC: 25 days supply | Qty: 100 | Fill #0

## 2016-03-25 MED FILL — ?METFORMIN HCL 500MG TABLET: 500 | 30 days supply | Qty: 60 | Fill #0

## 2016-03-25 MED FILL — !VENTOLIN HFA INHALER: 108 (90 BAS | 20 days supply | Qty: 18 | Fill #0

## 2016-03-25 MED FILL — TRUE METRIX TEST STRIP: 25 days supply | Qty: 100 | Fill #0

## 2016-03-25 MED FILL — ?BUPROPION HCL SR 150 MG TA: 150 | 30 days supply | Qty: 60 | Fill #0

## 2016-03-25 MED FILL — !LANTUS SOLOSTAR 100UNITS/M: 100 | 40 days supply | Qty: 6 | Fill #0

## 2016-03-25 MED FILL — ULTICARE PEN NDL 4MM 32G: 32G X 4 MM | 25 days supply | Qty: 100 | Fill #0

## 2016-03-25 MED FILL — ?GLIPIZIDE 10 MG TABLET: 10 | 30 days supply | Qty: 60 | Fill #0

## 2016-03-25 NOTE — Progress Notes (Signed)
Keith Liu, is a 40 y.o. male  EYC:[id number]  JSH:[id number]  DOB - 1976-01-17  CC:  Chief Complaint  Patient presents with  . Establish Care    Re Est  Care       HPI: Keith Liu is a 40 y.o. male here today to establish medical care for htn, hld, dm2, last sen in clinic 7/16.  Per pt, had difficult time getting appt to see [place], but has still been taking most of his meds.  He is only taking metformin qday though. Does ok w/ salt, but drinks soda and not watching his carbs as much.  Feels tired today, was playing dice all day yesterday w/ kids, lots of bending and standing up.  tob + , 1ppd x 30 years, wants to quit, patches in past worked for time, but stopped using that. Denies etoh.  Patient has No headache, No chest pain, No abdominal pain - No Nausea, No new weakness tingling or numbness, No Cough - SOB.  Gets mild sob sometimes, uses inhaler rarely.  Per pt, had tetanus shot about 4-5 years ago. Not interested in pneumonia vaccine at this time.  Review of Systems: Per HPI, o/w all systems reviewed and negative.  Allergies  Allergen Reactions  . Lisinopril Swelling    Angioedema of lower lip   Past Medical History  Diagnosis Date  . Hypertension   . Diabetes mellitus without complication [facility])    Current Outpatient Prescriptions on File Prior to Visit  Medication Sig Dispense Refill  . Blood Glucose Monitoring Suppl (TRUE METRIX METER) w/Device KIT USE AS INSTRUCTED 1 kit 0  . ketoconazole (NIZORAL) 2 % cream Apply 1 application topically 2 (two) times daily. 30 g 0  . VENTOLIN HFA 108 (90 Base) MCG/ACT inhaler INHALE 2 PUFFS INTO THE LUNGS EVERY 6 HOURS AS NEEDED FOR WHEEZING OR SHORTNESS OF BREATH. 18 g 0  . nicotine (NICODERM CQ - DOSED IN MG/24 HOURS) 14 mg/24hr patch Place 1 patch (14 mg total) onto the skin daily. (Patient not taking: Reported on 04/09/2015) 28 patch 0  . nicotine (NICODERM CQ - DOSED IN MG/24 HOURS) 21 mg/24hr patch Place 1 patch  (21 mg total) onto the skin daily. (Patient not taking: Reported on 04/09/2015) 28 patch 0  . nicotine (NICODERM CQ - DOSED IN MG/24 HR) 7 mg/24hr patch Place 1 patch (7 mg total) onto the skin daily. (Patient not taking: Reported on 04/09/2015) 28 patch 0  . [DISCONTINUED] lisinopril (PRINIVIL,ZESTRIL) 20 MG tablet Take 1 tablet (20 mg total) by mouth daily. 30 tablet 1   No current facility-administered medications on file prior to visit.   Family History  Problem Relation Age of Onset  . COPD Mother   . Diabetes Mother   . Hypertension Mother    Social History   Social History  . Marital Status: Single    Spouse Name: N/A  . Number of Children: N/A  . Years of Education: N/A   Occupational History  . Not on file.   Social History Main Topics  . Smoking status: Heavy Tobacco Smoker -- 1.50 packs/day    Types: Cigarettes  . Smokeless tobacco: Not on file  . Alcohol Use: 0.0 oz/week    0 Standard drinks or equivalent per week     Comment: occasional 03/13/15   . Drug Use: No  . Sexual Activity: Yes   Other Topics Concern  . Not on file   Social History Narrative    Objective:  Filed Vitals:   03/25/16 1016  BP: 108/72  Pulse: 91  Temp: 98.3 F (36.8 C)    Filed Weights   03/25/16 1016  Weight: 220 lb 6.4 oz (99.973 kg)    BP Readings from Last 3 Encounters:  03/25/16 108/72  08/17/15 127/90  03/13/15 144/94    Physical Exam: Constitutional: Patient appears well-developed and well-nourished. No distress. AAOx3, pleasant, tired appearing. HENT: Normocephalic, atraumatic, External right and left ear normal. Oropharynx is clear and moist.  bilat TMs clear. Eyes: Conjunctivae and EOM are normal. PERRL, no scleral icterus. Neck: Normal ROM. Neck supple. No JVD.  CVS: RRR, S1/S2 +, no murmurs, no gallops, no carotid bruit.  Pulmonary: Effort and breath sounds normal after deep inhalation, initial diminished sounds.  no stridor, rhonchi, wheezes, rales.    Abdominal: Soft. BS +, obese, no tenderness, rebound or guarding.  Musculoskeletal: Normal range of motion. No edema and no tenderness.  Foot exam: bilateral peripheral pulses 2+ (dorsalis pedis and post tibialis pulses), no ulcers noted/no ecchymosis, warm to touch, monofilament testing 3/3 bilat. Sensation intact.  No c/c/e. Neuro: Alert.  muscle tone coordination wnl. No cranial nerve deficit grossly. Skin: Skin is warm and dry. No rash noted. Not diaphoretic. No erythema. No pallor. Psychiatric: Normal mood and affect. Behavior, judgment, thought content normal.  Lab Results  Component Value Date   WBC 9.5 08/17/2015   HGB 16.9 08/17/2015   HCT 52.4* 08/17/2015   MCV 80.5 08/17/2015   PLT 174 08/17/2015   Lab Results  Component Value Date   CREATININE 0.79 08/17/2015   BUN 7 08/17/2015   NA 137 08/17/2015   K 3.4* 08/17/2015   CL 99* 08/17/2015   CO2 30 08/17/2015    Lab Results  Component Value Date   HGBA1C 10.0 03/25/2016   Lipid Panel     Component Value Date/Time   CHOL 177 08/24/2014 0934   TRIG 192* 08/24/2014 0934   HDL 43 08/24/2014 0934   CHOLHDL 4.1 08/24/2014 0934   VLDL 38 08/24/2014 0934   LDLCALC 96 08/24/2014 0934       Depression screen PHQ 2/9 03/25/2016 03/12/2015 12/12/2014 08/10/2014  Decreased Interest 0 0 0 0  Down, Depressed, Hopeless 0 0 0 0  PHQ - 2 Score 0 0 0 0    Assessment and plan:   1. Type 2 diabetes mellitus without complication, without long-term current use of insulin (HCC) Uncontrolled, per pt has been taking po meds - POCT glucose (manual entry) - POCT glycosylated hemoglobin (Hb A1C) 10 - Microalbumin/Creatinine Ratio, Urine - BASIC METABOLIC PANEL WITH GFR - CBC with Differential - lantus 15 units qhs started - same glipizide and metformin for now. - DM food education given, recd increase exercise. [facility] Liu pharm went to instruct pt use of solarstar pen, pt has used in past and abel to demonstrate use. - metFORMIN  (GLUCOPHAGE) 500 MG tablet; Take 1 tablet (500 mg total) by mouth 2 (two) times daily with a meal.  Dispense: 180 tablet; Refill: 3 - amb ref opth / eye exam  2. Essential hypertension Controlled, continue DASH diet. - hydrochlorothiazide (HYDRODIURIL) 25 MG tablet; Take 1 tablet (25 mg total) by mouth daily.  Dispense: 90 tablet; Refill: 3  3. Smoking, 1ppd x 30 year. Recd smoking cessation Tips given Trial welbutrin 150bid .  4. HLD (hyperlipidemia) - atorvastatin (LIPITOR) 10 MG tablet; Take 1 tablet (10 mg total) by mouth daily.  Dispense: 90 tablet; Refill: 3 -  Lipid Panel  5. Health maintanance  -recd pneumococal vaccine, pt will consider next time - per pt, had tdap about 4-5 years ago.  Return in about 3 months (around 06/25/2016).  The patient was given clear instructions to go to ER or return to medical center if symptoms don't improve, worsen or new problems develop. The patient verbalized understanding. The patient was told to call to get lab results if they haven't heard anything in the next week.    This note has been created with Surveyor, quantity. Any transcriptional errors are unintentional.   Maren Reamer, MD, [facility], [place]   03/25/2016, 10:47 AM

## 2016-03-25 NOTE — Patient Instructions (Addendum)
Keith Liu pharm 2-3 wks for dm chk  It was a pleasure seeing you today.  Signup for MyChart ! We can then communicate faster and easier with each other via e-mail.  Diabetes Mellitus and Food It is important for you to manage your blood sugar (glucose) level. Your blood glucose level can be greatly affected by what you eat. Eating healthier foods in the appropriate amounts throughout the day at about the same time each day will help you control your blood glucose level. It can also help slow or prevent worsening of your diabetes mellitus. Healthy eating may even help you improve the level of your blood pressure and reach or maintain a healthy weight.  General recommendations for healthful eating and cooking habits include:  Eating meals and snacks regularly. Avoid going long periods of time without eating to lose weight.  Eating a diet that consists mainly of plant-based foods, such as fruits, vegetables, nuts, legumes, and whole grains.  Using low-heat cooking methods, such as baking, instead of high-heat cooking methods, such as deep frying. Work with your dietitian to make sure you understand how to use the Nutrition Facts information on food labels. HOW CAN FOOD AFFECT ME? Carbohydrates Carbohydrates affect your blood glucose level more than any other type of food. Your dietitian will help you determine how many carbohydrates to eat at each meal and teach you how to count carbohydrates. Counting carbohydrates is important to keep your blood glucose at a healthy level, especially if you are using insulin or taking certain medicines for diabetes mellitus. Alcohol Alcohol can cause sudden decreases in blood glucose (hypoglycemia), especially if you use insulin or take certain medicines for diabetes mellitus. Hypoglycemia can be a life-threatening condition. Symptoms of hypoglycemia (sleepiness, dizziness, and disorientation) are similar to symptoms of having too much alcohol.  If your health  care provider has given you approval to drink alcohol, do so in moderation and use the following guidelines:  Women should not have more than one drink per day, and men should not have more than two drinks per day. One drink is equal to:  12 oz of beer.  5 oz of wine.  1 oz of hard liquor.  Do not drink on an empty stomach.  Keep yourself hydrated. Have water, diet soda, or unsweetened iced tea.  Regular soda, juice, and other mixers might contain a lot of carbohydrates and should be counted. WHAT FOODS ARE NOT RECOMMENDED? As you make food choices, it is important to remember that all foods are not the same. Some foods have fewer nutrients per serving than other foods, even though they might have the same number of calories or carbohydrates. It is difficult to get your body what it needs when you eat foods with fewer nutrients. Examples of foods that you should avoid that are high in calories and carbohydrates but low in nutrients include:  Trans fats (most processed foods list trans fats on the Nutrition Facts label).  Regular soda.  Juice.  Candy.  Sweets, such as cake, pie, doughnuts, and cookies.  Fried foods. WHAT FOODS CAN I EAT? Eat nutrient-rich foods, which will nourish your body and keep you healthy. The food you should eat also will depend on several factors, including:  The calories you need.  The medicines you take.  Your weight.  Your blood glucose level.  Your blood pressure level.  Your cholesterol level. You should eat a variety of foods, including:  Protein.  Lean cuts of meat.  Proteins  low in saturated fats, such as fish, egg whites, and beans. Avoid processed meats.  Fruits and vegetables.  Fruits and vegetables that may help control blood glucose levels, such as apples, mangoes, and yams.  Dairy products.  Choose fat-free or low-fat dairy products, such as milk, yogurt, and cheese.  Grains, bread, pasta, and rice.  Choose whole  grain products, such as multigrain bread, whole oats, and brown rice. These foods may help control blood pressure.  Fats.  Foods containing healthful fats, such as nuts, avocado, olive oil, canola oil, and fish. DOES EVERYONE WITH DIABETES MELLITUS HAVE THE SAME MEAL PLAN? Because every person with diabetes mellitus is different, there is not one meal plan that works for everyone. It is very important that you meet with a dietitian who will help you create a meal plan that is just right for you.   This information is not intended to replace advice given to you by your health care provider. Make sure you discuss any questions you have with your health care provider.   Document Released: 05/21/2005 Document Revised: 09/14/2014 Document Reviewed: 07/21/2013 Elsevier Interactive Patient Education 2016 [employer].  -  Tips for Eating Away From Home If You Have Diabetes Controlling your level of blood glucose, also known as blood sugar, can be challenging. It can be even more difficult when you do not prepare your own meals. The following tips can help you manage your diabetes when you eat away from home. PLANNING AHEAD Plan ahead if you know you will be eating away from home:  Ask your health care provider how to time meals and medicine if you are taking insulin.  Make a list of restaurants near you that offer healthy choices. If they have a carry-out menu, take it home and plan what you will order ahead of time.  Look up the restaurant you want to eat at online. Many chain and fast-food restaurants list nutritional information online. Use this information to choose the healthiest options and to calculate how many carbohydrates will be in your meal.  Use a carbohydrate-counting book or mobile app to look up the carbohydrate content and serving size of the foods you want to eat.  Become familiar with serving sizes and learn to recognize how many servings are in a portion. This will allow you  to estimate how many carbohydrates you can eat. FREE FOODS A "free food" is any food or drink that has less than 5 g of carbohydrates per serving. Free foods include:  Many vegetables.  Hard boiled eggs.  Nuts or seeds.  Olives.  Cheeses.  Meats. These types of foods make good appetizer choices and are often available at salad bars. Lemon juice, vinegar, or a low-calorie salad dressing of fewer than 20 calories per serving can be used as a "free" salad dressing.  CHOICES TO REDUCE CARBOHYDRATES  Substitute nonfat sweetened yogurt with a sugar-free yogurt. Yogurt made from soy milk may also be used, but you will still want a sugar-free or plain option to choose a lower carbohydrate amount.  Ask your server to take away the bread basket or chips from your table.  Order fresh fruit. A salad bar often offers fresh fruit choices. Avoid canned fruit because it is usually packed in sugar or syrup.  Order a salad, and eat it without dressing. Or, create a "free" salad dressing.  Ask for substitutions. For example, instead of [place] fries, request an order of a vegetable such as salad, green beans, or  broccoli. OTHER TIPS   If you take insulin, take the insulin once your food arrives to your table. This will ensure your insulin and food are timed correctly.  Ask your server about the portion size before your order, and ask for a take-out box if the portion has more servings than you should have. When your food comes, leave the amount you should have on the plate, and put the rest in the take-out box.  Consider splitting an entree with someone and ordering a side salad.   This information is not intended to replace advice given to you by your health care provider. Make sure you discuss any questions you have with your health care provider.   Document Released: 08/24/2005 Document Revised: 05/15/2015 Document Reviewed: 11/21/2013 Elsevier Interactive Patient Education 2016 [employer].  - Diabetes and Exercise Exercising regularly is important. It is not just about losing weight. It has many health benefits, such as:  Improving your overall fitness, flexibility, and endurance.  Increasing your bone density.  Helping with weight control.  Decreasing your body fat.  Increasing your muscle strength.  Reducing stress and tension.  Improving your overall health. People with diabetes who exercise gain additional benefits because exercise:  Reduces appetite.  Improves the body's use of blood sugar (glucose).  Helps lower or control blood glucose.  Decreases blood pressure.  Helps control blood lipids (such as cholesterol and triglycerides).  Improves the body's use of the hormone insulin by:  Increasing the body's insulin sensitivity.  Reducing the body's insulin needs.  Decreases the risk for heart disease because exercising:  Lowers cholesterol and triglycerides levels.  Increases the levels of good cholesterol (such as high-density lipoproteins [HDL]) in the body.  Lowers blood glucose levels. YOUR ACTIVITY PLAN  Choose an activity that you enjoy, and set realistic goals. To exercise safely, you should begin practicing any new physical activity slowly, and gradually increase the intensity of the exercise over time. Your health care provider or diabetes educator can help create an activity plan that works for you. General recommendations include:  Encouraging children to engage in at least 60 minutes of physical activity each day.  Stretching and performing strength training exercises, such as yoga or weight lifting, at least 2 times per week.  Performing a total of at least 150 minutes of moderate-intensity exercise each week, such as brisk walking or water aerobics.  Exercising at least 3 days per week, making sure you allow no more than 2 consecutive days to pass without exercising.  Avoiding long periods of inactivity (90 minutes or more).  When you have to spend an extended period of time sitting down, take frequent breaks to walk or stretch. RECOMMENDATIONS FOR EXERCISING WITH TYPE 1 OR TYPE 2 DIABETES   Check your blood glucose before exercising. If blood glucose levels are greater than 240 mg/dL, check for urine ketones. Do not exercise if ketones are present.  Avoid injecting insulin into areas of the body that are going to be exercised. For example, avoid injecting insulin into:  The arms when playing tennis.  The legs when jogging.  Keep a record of:  Food intake before and after you exercise.  Expected peak times of insulin action.  Blood glucose levels before and after you exercise.  The type and amount of exercise you have done.  Review your records with your health care provider. Your health care provider will help you to develop guidelines for adjusting food intake and insulin amounts before and  after exercising.  If you take insulin or oral hypoglycemic agents, watch for signs and symptoms of hypoglycemia. They include:  Dizziness.  Shaking.  Sweating.  Chills.  Confusion.  Drink plenty of water while you exercise to prevent dehydration or heat stroke. Body water is lost during exercise and must be replaced.  Talk to your health care provider before starting an exercise program to make sure it is safe for you. Remember, almost any type of activity is better than none.   This information is not intended to replace advice given to you by your health care provider. Make sure you discuss any questions you have with your health care provider.   Document Released: 11/14/2003 Document Revised: 01/08/2015 Document Reviewed: 01/31/2013 Elsevier Interactive Patient Education 2016 Elsevier Inc.  - DASH Eating Plan DASH stands for "Dietary Approaches to Stop Hypertension." The DASH eating plan is a healthy eating plan that has been shown to reduce high blood pressure (hypertension). Additional health  benefits may include reducing the risk of type 2 diabetes mellitus, heart disease, and stroke. The DASH eating plan may also help with weight loss. WHAT DO I NEED TO KNOW ABOUT THE DASH EATING PLAN? For the DASH eating plan, you will follow these general guidelines:  Choose foods with a percent daily value for sodium of less than 5% (as listed on the food label).  Use salt-free seasonings or herbs instead of table salt or sea salt.  Check with your health care provider or pharmacist before using salt substitutes.  Eat lower-sodium products, often labeled as "lower sodium" or "no salt added."  Eat fresh foods.  Eat more vegetables, fruits, and low-fat dairy products.  Choose whole grains. Look for the word "whole" as the first word in the ingredient list.  Choose fish and skinless chicken or [place] more often than red meat. Limit fish, poultry, and meat to 6 oz (170 g) each day.  Limit sweets, desserts, sugars, and sugary drinks.  Choose heart-healthy fats.  Limit cheese to 1 oz (28 g) per day.  Eat more home-cooked food and less restaurant, buffet, and fast food.  Limit fried foods.  Cook foods using methods other than frying.  Limit canned vegetables. If you do use them, rinse them well to decrease the sodium.  When eating at a restaurant, ask that your food be prepared with less salt, or no salt if possible. WHAT FOODS CAN I EAT? Seek help from a dietitian for individual calorie needs. Grains Whole grain or whole wheat bread. Brown rice. Whole grain or whole wheat pasta. Quinoa, bulgur, and whole grain cereals. Low-sodium cereals. Corn or whole wheat flour tortillas. Whole grain cornbread. Whole grain crackers. Low-sodium crackers. Vegetables Fresh or frozen vegetables (raw, steamed, roasted, or grilled). Low-sodium or reduced-sodium tomato and vegetable juices. Low-sodium or reduced-sodium tomato sauce and paste. Low-sodium or reduced-sodium canned vegetables.  Fruits All  fresh, canned (in natural juice), or frozen fruits. Meat and Other Protein Products Ground beef (85% or leaner), grass-fed beef, or beef trimmed of fat. Skinless chicken or [place]. Ground chicken or [place]. Pork trimmed of fat. All fish and seafood. Eggs. Dried beans, peas, or lentils. Unsalted nuts and seeds. Unsalted canned beans. Dairy Low-fat dairy products, such as skim or 1% milk, 2% or reduced-fat cheeses, low-fat ricotta or cottage cheese, or plain low-fat yogurt. Low-sodium or reduced-sodium cheeses. Fats and Oils Tub margarines without trans fats. Light or reduced-fat mayonnaise and salad dressings (reduced sodium). Avocado. Safflower, olive, or canola oils. Natural  peanut or almond butter. Other Unsalted popcorn and pretzels. The items listed above may not be a complete list of recommended foods or beverages. Contact your dietitian for more options. WHAT FOODS ARE NOT RECOMMENDED? Grains White bread. White pasta. White rice. Refined cornbread. Bagels and croissants. Crackers that contain trans fat. Vegetables Creamed or fried vegetables. Vegetables in a cheese sauce. Regular canned vegetables. Regular canned tomato sauce and paste. Regular tomato and vegetable juices. Fruits Dried fruits. Canned fruit in light or heavy syrup. Fruit juice. Meat and Other Protein Products Fatty cuts of meat. Ribs, chicken wings, bacon, sausage, bologna, salami, chitterlings, fatback, hot dogs, bratwurst, and packaged luncheon meats. Salted nuts and seeds. Canned beans with salt. Dairy Whole or 2% milk, cream, half-and-half, and cream cheese. Whole-fat or sweetened yogurt. Full-fat cheeses or blue cheese. Nondairy creamers and whipped toppings. Processed cheese, cheese spreads, or cheese curds. Condiments Onion and garlic salt, seasoned salt, table salt, and sea salt. Canned and packaged gravies. Worcestershire sauce. Tartar sauce. Barbecue sauce. Teriyaki sauce. Soy sauce, including reduced sodium.  Steak sauce. Fish sauce. Oyster sauce. Cocktail sauce. Horseradish. Ketchup and mustard. Meat flavorings and tenderizers. Bouillon cubes. Hot sauce. Tabasco sauce. Marinades. Taco seasonings. Relishes. Fats and Oils Butter, stick margarine, lard, shortening, ghee, and bacon fat. Coconut, palm kernel, or palm oils. Regular salad dressings. Other Pickles and olives. Salted popcorn and pretzels. The items listed above may not be a complete list of foods and beverages to avoid. Contact your dietitian for more information. WHERE CAN I FIND MORE INFORMATION? National Heart, Lung, and Blood Institute: [employer]   This information is not intended to replace advice given to you by your health care provider. Make sure you discuss any questions you have with your health care provider.   Document Released: 08/13/2011 Document Revised: 09/14/2014 Document Reviewed: 06/28/2013 Elsevier Interactive Patient Education 2016 [employer].   - You Can Quit Smoking If you are ready to quit smoking or are thinking about it, congratulations! You have chosen to help yourself be healthier and live longer! There are lots of different ways to quit smoking. Nicotine gum, nicotine patches, a nicotine inhaler, or nicotine nasal spray can help with physical craving. Hypnosis, support groups, and medicines help break the habit of smoking. TIPS TO GET OFF AND STAY OFF CIGARETTES  Learn to predict your moods. Do not let a bad situation be your excuse to have a cigarette. Some situations in your life might tempt you to have a cigarette.  Ask friends and co-workers not to smoke around you.  Make your home smoke-free.  Never have "just one" cigarette. It leads to wanting another and another. Remind yourself of your decision to quit.  On a card, make a list of your reasons for not smoking. Read it at least the same number of times a day as you have a cigarette. Tell yourself  everyday, "I do not want to smoke. I choose not to smoke."  Ask someone at home or work to help you with your plan to quit smoking.  Have something planned after you eat or have a cup of coffee. Take a walk or get other exercise to perk you up. This will help to keep you from overeating.  Try a relaxation exercise to calm you down and decrease your stress. Remember, you may be tense and nervous the first two weeks after you quit. This will pass.  Find new activities to keep your hands busy. Play with a pen, coin, or rubber  band. Doodle or draw things on paper.  Brush your teeth right after eating. This will help cut down the craving for the taste of tobacco after meals. You can try mouthwash too.  Try gum, breath mints, or diet candy to keep something in your mouth. IF YOU SMOKE AND WANT TO QUIT:  Do not stock up on cigarettes. Never buy a carton. Wait until one pack is finished before you buy another.  Never carry cigarettes with you at work or at home.  Keep cigarettes as far away from you as possible. Leave them with someone else.  Never carry matches or a lighter with you.  Ask yourself, "Do I need this cigarette or is this just a reflex?"  Bet with someone that you can quit. Put cigarette money in a piggy bank every morning. If you smoke, you give up the money. If you do not smoke, by the end of the week, you keep the money.  Keep trying. It takes 21 days to change a habit!  Talk to your doctor about using medicines to help you quit. These include nicotine replacement gum, lozenges, or skin patches.   This information is not intended to replace advice given to you by your health care provider. Make sure you discuss any questions you have with your health care provider.   Document Released: 06/20/2009 Document Revised: 11/16/2011 Document Reviewed: 06/20/2009 Elsevier Interactive Patient Education [employer] Else[employer] Inc.

## 2016-03-26 ENCOUNTER — Other Ambulatory Visit: Payer: Self-pay

## 2016-03-31 ENCOUNTER — Other Ambulatory Visit: Payer: Self-pay

## 2016-04-02 ENCOUNTER — Ambulatory Visit: Payer: Self-pay | Attending: Internal Medicine

## 2016-04-02 DIAGNOSIS — E119 Type 2 diabetes mellitus without complications: Secondary | ICD-10-CM | POA: Insufficient documentation

## 2016-04-02 LAB — CBC WITH DIFFERENTIAL/PLATELET
BASOS ABS: 83 {cells}/uL (ref 0–200)
Basophils Relative: 1 %
EOS PCT: 3 %
Eosinophils Absolute: 249 cells/uL (ref 15–500)
HCT: 50 % (ref 38.5–50.0)
Hemoglobin: 16.7 g/dL (ref 13.2–17.1)
Lymphocytes Relative: 41 %
Lymphs Abs: 3403 cells/uL (ref 850–3900)
MCH: 26.3 pg — AB (ref 27.0–33.0)
MCHC: 33.4 g/dL (ref 32.0–36.0)
MCV: 78.9 fL — ABNORMAL LOW (ref 80.0–100.0)
MONOS PCT: 6 %
MPV: 11.1 fL (ref 7.5–12.5)
Monocytes Absolute: 498 cells/uL (ref 200–950)
NEUTROS ABS: 4067 {cells}/uL (ref 1500–7800)
Neutrophils Relative %: 49 %
PLATELETS: 214 10*3/uL (ref 140–400)
RBC: 6.34 MIL/uL — AB (ref 4.20–5.80)
RDW: 14.2 % (ref 11.0–15.0)
WBC: 8.3 10*3/uL (ref 3.8–10.8)

## 2016-04-02 LAB — LIPID PANEL
CHOL/HDL RATIO: 3.5 ratio (ref <=5.0)
CHOLESTEROL: 133 mg/dL (ref 125–200)
HDL: 38 mg/dL — ABNORMAL LOW (ref >=40)
LDL Cholesterol: 68 mg/dL (ref <130)
TRIGLYCERIDES: 135 mg/dL (ref <150)
VLDL: 27 mg/dL (ref <30)

## 2016-04-02 LAB — BASIC METABOLIC PANEL WITH GFR
BUN: 9 mg/dL (ref 7–25)
CALCIUM: 9.6 mg/dL (ref 8.6–10.3)
CO2: 28 mmol/L (ref 20–31)
CREATININE: 0.83 mg/dL (ref 0.60–1.35)
Chloride: 102 mmol/L (ref 98–110)
GLUCOSE: 157 mg/dL — AB (ref 65–99)
Potassium: 4.3 mmol/L (ref 3.5–5.3)
Sodium: 141 mmol/L (ref 135–146)

## 2016-04-03 LAB — MICROALBUMIN / CREATININE URINE RATIO
CREATININE, URINE: 84 mg/dL (ref 20–370)
MICROALB UR: 3.4 mg/dL
Microalb Creat Ratio: 40 mcg/mg creat — ABNORMAL HIGH (ref <30)

## 2016-04-08 ENCOUNTER — Telehealth: Payer: Self-pay

## 2016-04-08 NOTE — Telephone Encounter (Signed)
Contacted pt to go over lab results pt wife answered the phone and she will let him know to give me a call at his earliest convenience

## 2016-04-08 NOTE — Telephone Encounter (Signed)
Pt returned call made of aware of lab results and doesn't have any questions or concerns. Pt gave verbal order for his wife Ahmire Seepersad to receive results as well

## 2016-04-13 MED FILL — HYDROCHLOROTHIAZIDE 25 MG T: 25 | 30 days supply | Qty: 30 | Fill #0

## 2016-04-13 MED FILL — ?ATORVASTATIN 10 MG TABLET: 10 | 30 days supply | Qty: 30 | Fill #0

## 2016-04-13 MED FILL — ?AMLODIPINE BESYLATE 10 MG: 10 | 30 days supply | Qty: 30 | Fill #4

## 2016-05-12 MED FILL — ?METFORMIN HCL 500MG TABLET: 500 | 30 days supply | Qty: 60 | Fill #1

## 2016-05-12 MED FILL — glipiZIDE 10 MG TABS: 10 | 30 days supply | Qty: 60 | Fill #1

## 2016-05-12 MED FILL — !LANTUS SOLOSTAR 100UNITS/M: 100 | 40 days supply | Qty: 6 | Fill #1

## 2016-05-12 MED FILL — ?ATORVASTATIN 10 MG TABLET: 10 | 30 days supply | Qty: 30 | Fill #1

## 2016-05-12 MED FILL — ULTICARE PEN NDL 4MM 32G: 32G X 4 MM | 25 days supply | Qty: 100 | Fill #1

## 2016-05-12 MED FILL — AMLODIPINE BESYLATE 10 MG T: 10 | 30 days supply | Qty: 30 | Fill #0

## 2016-05-12 MED FILL — HYDROCHLOROTHIAZIDE 25 MG T: 25 | 30 days supply | Qty: 30 | Fill #1

## 2016-06-15 MED FILL — ?ATORVASTATIN 10 MG TABLET: 10 | 30 days supply | Qty: 30 | Fill #2

## 2016-06-15 MED FILL — glipiZIDE 10 MG TABS: 10 | 30 days supply | Qty: 60 | Fill #2

## 2016-06-15 MED FILL — HYDROCHLOROTHIAZIDE 25 MG T: 25 | 30 days supply | Qty: 30 | Fill #2

## 2016-07-08 MED FILL — ?METFORMIN HCL 500MG TABLET: 500 | 30 days supply | Qty: 60 | Fill #2

## 2016-07-09 MED FILL — ?AMLODIPINE BESYLATE 10 MG: 10 | 30 days supply | Qty: 30 | Fill #5

## 2016-07-14 MED FILL — !LANTUS SOLOSTAR 100UNITS/M: 100 | 40 days supply | Qty: 6 | Fill #2

## 2016-07-21 ENCOUNTER — Encounter: Payer: Self-pay | Admitting: Internal Medicine

## 2016-07-21 ENCOUNTER — Ambulatory Visit: Payer: Self-pay | Attending: Internal Medicine | Admitting: Internal Medicine

## 2016-07-21 VITALS — BP 143/90 | HR 88 | Temp 97.9°F | Resp 16 | Wt 227.2 lb

## 2016-07-21 DIAGNOSIS — E1129 Type 2 diabetes mellitus with other diabetic kidney complication: Secondary | ICD-10-CM

## 2016-07-21 DIAGNOSIS — E119 Type 2 diabetes mellitus without complications: Secondary | ICD-10-CM | POA: Insufficient documentation

## 2016-07-21 DIAGNOSIS — R809 Proteinuria, unspecified: Secondary | ICD-10-CM

## 2016-07-21 DIAGNOSIS — Z7982 Long term (current) use of aspirin: Secondary | ICD-10-CM | POA: Insufficient documentation

## 2016-07-21 DIAGNOSIS — F17210 Nicotine dependence, cigarettes, uncomplicated: Secondary | ICD-10-CM | POA: Insufficient documentation

## 2016-07-21 DIAGNOSIS — E785 Hyperlipidemia, unspecified: Secondary | ICD-10-CM

## 2016-07-21 DIAGNOSIS — I10 Essential (primary) hypertension: Secondary | ICD-10-CM

## 2016-07-21 DIAGNOSIS — Z888 Allergy status to other drugs, medicaments and biological substances status: Secondary | ICD-10-CM | POA: Insufficient documentation

## 2016-07-21 DIAGNOSIS — Z794 Long term (current) use of insulin: Secondary | ICD-10-CM

## 2016-07-21 DIAGNOSIS — Z9119 Patient's noncompliance with other medical treatment and regimen: Secondary | ICD-10-CM | POA: Insufficient documentation

## 2016-07-21 LAB — GLUCOSE, POCT (MANUAL RESULT ENTRY): POC Glucose: 237 mg/dl — AB (ref 70–99)

## 2016-07-21 LAB — POCT GLYCOSYLATED HEMOGLOBIN (HGB A1C): HEMOGLOBIN A1C: 10

## 2016-07-21 MED ORDER — NICOTINE 21 MG/24HR TD PT24: 21.0000 mg | MEDICATED_PATCH | Freq: Every day | TRANSDERMAL | 0 refills | Status: DC

## 2016-07-21 MED ORDER — INSULIN GLARGINE 100 UNIT/ML SOLOSTAR PEN: 18.0000 [IU] | PEN_INJECTOR | Freq: Every day | SUBCUTANEOUS | 3 refills | Status: DC

## 2016-07-21 MED ORDER — NICOTINE 14 MG/24HR TD PT24: 14.0000 mg | MEDICATED_PATCH | Freq: Every day | TRANSDERMAL | 0 refills | Status: DC

## 2016-07-21 MED ORDER — GLIPIZIDE 10 MG PO TABS: 10.0000 mg | ORAL_TABLET | Freq: Two times a day (BID) | ORAL | 3 refills | Status: DC

## 2016-07-21 MED ORDER — AMLODIPINE BESYLATE 10 MG PO TABS: 10.0000 mg | ORAL_TABLET | Freq: Every day | ORAL | 4 refills | Status: DC

## 2016-07-21 MED ORDER — INSULIN PEN NEEDLE 32G X 4 MM MISC: 1.0000 | Freq: Every day | 2 refills | Status: DC

## 2016-07-21 MED ORDER — ASPIRIN 81 MG PO CHEW
81.0000 mg | CHEWABLE_TABLET | Freq: Every day | ORAL | 3 refills | Status: DC
Start: 2016-07-21 — End: 2016-12-29

## 2016-07-21 MED ORDER — GLUCOSE BLOOD VI STRP: ORAL_STRIP | 12 refills | Status: DC

## 2016-07-21 MED ORDER — NICOTINE 7 MG/24HR TD PT24: 7.0000 mg | MEDICATED_PATCH | Freq: Every day | TRANSDERMAL | 0 refills | Status: DC

## 2016-07-21 MED ORDER — HYDROCHLOROTHIAZIDE 25 MG PO TABS: 25.0000 mg | ORAL_TABLET | Freq: Every day | ORAL | 3 refills | Status: DC

## 2016-07-21 MED ORDER — ATORVASTATIN CALCIUM 10 MG PO TABS: 10.0000 mg | ORAL_TABLET | Freq: Every day | ORAL | 3 refills | Status: DC

## 2016-07-21 MED FILL — HYDROCHLOROTHIAZIDE 25 MG T: 25 | 30 days supply | Qty: 30 | Fill #0

## 2016-07-21 MED FILL — TRUE METRIX TEST STRIP: 30 days supply | Qty: 100 | Fill #0

## 2016-07-21 MED FILL — ATORVASTATIN 10 MG TABLET: 10 | 30 days supply | Qty: 30 | Fill #0

## 2016-07-21 MED FILL — ULTICARE PEN NDL 4MM 32G: 32G X 4 MM | 30 days supply | Qty: 100 | Fill #0

## 2016-07-21 MED FILL — glipiZIDE 10 MG TABS: 10 | 30 days supply | Qty: 60 | Fill #0

## 2016-07-21 NOTE — Patient Instructions (Addendum)
- financial aid packet  - [facility] /dm chk.   Check blood sugars on waking up daily.  Also check blood sugars about 2 hours after a meal and do this after different meals by rotation  Recommended blood sugar levels on waking up is 90-130 and about 2 hours after meal is 130-160  Please bring your blood sugar monitor to each visit, thank you  Restart exercise  Continue cholesterol med.   -  IF AM blood sugar is  >150, increase lantus by 1 unit at night.  Continue to check sugars daily. On day 4, if blood sugar >150, continue to increase lantus by 1 unit.  If blood sugar < 70s, than need to reduce dose by 1 unit nightly.  -- Diabetes Mellitus and Food It is important for you to manage your blood sugar (glucose) level. Your blood glucose level can be greatly affected by what you eat. Eating healthier foods in the appropriate amounts throughout the day at about the same time each day will help you control your blood glucose level. It can also help slow or prevent worsening of your diabetes mellitus. Healthy eating may even help you improve the level of your blood pressure and reach or maintain a healthy weight. General recommendations for healthful eating and cooking habits include:  Eating meals and snacks regularly. Avoid going long periods of time without eating to lose weight.  Eating a diet that consists mainly of plant-based foods, such as fruits, vegetables, nuts, legumes, and whole grains.  Using low-heat cooking methods, such as baking, instead of high-heat cooking methods, such as deep frying. Work with your dietitian to make sure you understand how to use the Nutrition Facts information on food labels. How can food affect me? Carbohydrates  Carbohydrates affect your blood glucose level more than any other type of food. Your dietitian will help you determine how many carbohydrates to eat at each meal and teach you how to count carbohydrates. Counting  carbohydrates is important to keep your blood glucose at a healthy level, especially if you are using insulin or taking certain medicines for diabetes mellitus. Alcohol  Alcohol can cause sudden decreases in blood glucose (hypoglycemia), especially if you use insulin or take certain medicines for diabetes mellitus. Hypoglycemia can be a life-threatening condition. Symptoms of hypoglycemia (sleepiness, dizziness, and disorientation) are similar to symptoms of having too much alcohol. If your health care provider has given you approval to drink alcohol, do so in moderation and use the following guidelines:  Women should not have more than one drink per day, and men should not have more than two drinks per day. One drink is equal to:  12 oz of beer.  5 oz of wine.  1 oz of hard liquor.  Do not drink on an empty stomach.  Keep yourself hydrated. Have water, diet soda, or unsweetened iced tea.  Regular soda, juice, and other mixers might contain a lot of carbohydrates and should be counted. What foods are not recommended? As you make food choices, it is important to remember that all foods are not the same. Some foods have fewer nutrients per serving than other foods, even though they might have the same number of calories or carbohydrates. It is difficult to get your body what it needs when you eat foods with fewer nutrients. Examples of foods that you should avoid that are high in calories and carbohydrates but low in nutrients include:  Trans fats (most processed foods  list trans fats on the Nutrition Facts label).  Regular soda.  Juice.  Candy.  Sweets, such as cake, pie, doughnuts, and cookies.  Fried foods. What foods can I eat? Eat nutrient-rich foods, which will nourish your body and keep you healthy. The food you should eat also will depend on several factors, including:  The calories you need.  The medicines you take.  Your weight.  Your blood glucose level.  Your  blood pressure level.  Your cholesterol level. You should eat a variety of foods, including:  Protein.  Lean cuts of meat.  Proteins low in saturated fats, such as fish, egg whites, and beans. Avoid processed meats.  Fruits and vegetables.  Fruits and vegetables that may help control blood glucose levels, such as apples, mangoes, and yams.  Dairy products.  Choose fat-free or low-fat dairy products, such as milk, yogurt, and cheese.  Grains, bread, pasta, and rice.  Choose whole grain products, such as multigrain bread, whole oats, and brown rice. These foods may help control blood pressure.  Fats.  Foods containing healthful fats, such as nuts, avocado, olive oil, canola oil, and fish. Does everyone with diabetes mellitus have the same meal plan? Because every person with diabetes mellitus is different, there is not one meal plan that works for everyone. It is very important that you meet with a dietitian who will help you create a meal plan that is just right for you. This information is not intended to replace advice given to you by your health care provider. Make sure you discuss any questions you have with your health care provider. Document Released: 05/21/2005 Document Revised: 01/30/2016 Document Reviewed: 07/21/2013 Elsevier Interactive Patient Education  2017 [employer]. --  Tips for Eating Away From Home If You Have Diabetes Controlling your level of blood glucose, also known as blood sugar, can be challenging. It can be even more difficult when you do not prepare your own meals. The following tips can help you manage your diabetes when you eat away from home. Planning ahead Plan ahead if you know you will be eating away from home:  Ask your health care provider how to time meals and medicine if you are taking insulin.  Make a list of restaurants near you that offer healthy choices. If they have a carry-out menu, take it home and plan what you will order ahead of  time.  Look up the restaurant you want to eat at online. Many chain and fast-food restaurants list nutritional information online. Use this information to choose the healthiest options and to calculate how many carbohydrates will be in your meal.  Use a carbohydrate-counting book or mobile app to look up the carbohydrate content and serving size of the foods you want to eat.  Become familiar with serving sizes and learn to recognize how many servings are in a portion. This will allow you to estimate how many carbohydrates you can eat. Free foods A "free food" is any food or drink that has less than 5 g of carbohydrates per serving. Free foods include:  Many vegetables.  Hard boiled eggs.  Nuts or seeds.  Olives.  Cheeses.  Meats. These types of foods make good appetizer choices and are often available at salad bars. Lemon juice, vinegar, or a low-calorie salad dressing of fewer than 20 calories per serving can be used as a "free" salad dressing. Choices to reduce carbohydrates  Substitute nonfat sweetened yogurt with a sugar-free yogurt. Yogurt made from soy milk may  also be used, but you will still want a sugar-free or plain option to choose a lower carbohydrate amount.  Ask your server to take away the bread basket or chips from your table.  Order fresh fruit. A salad bar often offers fresh fruit choices. Avoid canned fruit because it is usually packed in sugar or syrup.  Order a salad, and eat it without dressing. Or, create a "free" salad dressing.  Ask for substitutions. For example, instead of [place] fries, request an order of a vegetable such as salad, green beans, or broccoli. Other tips  If you take insulin, take the insulin once your food arrives to your table. This will ensure your insulin and food are timed correctly.  Ask your server about the portion size before your order, and ask for a take-out box if the portion has more servings than you should have. When your  food comes, leave the amount you should have on the plate, and put the rest in the take-out box.  Consider splitting an entree with someone and ordering a side salad. This information is not intended to replace advice given to you by your health care provider. Make sure you discuss any questions you have with your health care provider. Document Released: 08/24/2005 Document Revised: 01/30/2016 Document Reviewed: 11/21/2013 Elsevier Interactive Patient Education  2017 [employer] Inc[employer]  -  Low-Sodium Eating Plan Sodium raises blood pressure and causes water to be held in the body. Getting less sodium from food will help lower your blood pressure, reduce any swelling, and protect your heart, liver, and kidneys. We get sodium by adding salt (sodium chloride) to food. Most of our sodium comes from canned, boxed, and frozen foods. Restaurant foods, fast foods, and pizza are also very high in sodium. Even if you take medicine to lower your blood pressure or to reduce fluid in your body, getting less sodium from your food is important. What is my plan? Most people should limit their sodium intake to 2,300 mg a day. Your health care provider recommends that you limit your sodium intake to 2000mg   a day. What do I need to know about this eating plan? For the low-sodium eating plan, you will follow these general guidelines:  Choose foods with a % Daily Value for sodium of less than 5% (as listed on the food label).  Use salt-free seasonings or herbs instead of table salt or sea salt.  Check with your health care provider or pharmacist before using salt substitutes.  Eat fresh foods.  Eat more vegetables and fruits.  Limit canned vegetables. If you do use them, rinse them well to decrease the sodium.  Limit cheese to 1 oz (28 g) per day.  Eat lower-sodium products, often labeled as "lower sodium" or "no salt added."  Avoid foods that contain monosodium glutamate (MSG). MSG is sometimes added to  [place] food and some canned foods.  Check food labels (Nutrition Facts labels) on foods to learn how much sodium is in one serving.  Eat more home-cooked food and less restaurant, buffet, and fast food.  When eating at a restaurant, ask that your food be prepared with less salt, or no salt if possible. How do I read food labels for sodium information? The Nutrition Facts label lists the amount of sodium in one serving of the food. If you eat more than one serving, you must multiply the listed amount of sodium by the number of servings. Food labels may also identify foods as:  Sodium free-Less  than 5 mg in a serving.  Very low sodium-35 mg or less in a serving.  Low sodium-140 mg or less in a serving.  Light in sodium-50% less sodium in a serving. For example, if a food that usually has 300 mg of sodium is changed to become light in sodium, it will have 150 mg of sodium.  Reduced sodium-25% less sodium in a serving. For example, if a food that usually has 400 mg of sodium is changed to reduced sodium, it will have 300 mg of sodium. What foods can I eat? Grains  Low-sodium cereals, including oats, puffed wheat and rice, and shredded wheat cereals. Low-sodium crackers. Unsalted rice and pasta. Lower-sodium bread. Vegetables  Frozen or fresh vegetables. Low-sodium or reduced-sodium canned vegetables. Low-sodium or reduced-sodium tomato sauce and paste. Low-sodium or reduced-sodium tomato and vegetable juices. Fruits  Fresh, frozen, and canned fruit. Fruit juice. Meat and Other Protein Products  Low-sodium canned tuna and salmon. Fresh or frozen meat, poultry, seafood, and fish. Lamb. Unsalted nuts. Dried beans, peas, and lentils without added salt. Unsalted canned beans. Homemade soups without salt. Eggs. Dairy  Milk. Soy milk. Ricotta cheese. Low-sodium or reduced-sodium cheeses. Yogurt. Condiments  Fresh and dried herbs and spices. Salt-free seasonings. Onion and garlic powders.  Low-sodium varieties of mustard and ketchup. Fresh or refrigerated horseradish. Lemon juice. Fats and Oils  Reduced-sodium salad dressings. Unsalted butter. Other  Unsalted popcorn and pretzels. The items listed above may not be a complete list of recommended foods or beverages. Contact your dietitian for more options.  What foods are not recommended? Grains  Instant hot cereals. Bread stuffing, pancake, and biscuit mixes. Croutons. Seasoned rice or pasta mixes. Noodle soup cups. Boxed or frozen macaroni and cheese. Self-rising flour. Regular salted crackers. Vegetables  Regular canned vegetables. Regular canned tomato sauce and paste. Regular tomato and vegetable juices. Frozen vegetables in sauces. Salted [place] fries. Olives. Rosita Fire. Relishes. Sauerkraut. Salsa. Meat and Other Protein Products  Salted, canned, smoked, spiced, or pickled meats, seafood, or fish. Bacon, ham, sausage, hot dogs, corned beef, chipped beef, and packaged luncheon meats. Salt pork. Jerky. Pickled herring. Anchovies, regular canned tuna, and sardines. Salted nuts. Dairy  Processed cheese and cheese spreads. Cheese curds. Blue cheese and cottage cheese. Buttermilk. Condiments  Onion and garlic salt, seasoned salt, table salt, and sea salt. Canned and packaged gravies. Worcestershire sauce. Tartar sauce. Barbecue sauce. Teriyaki sauce. Soy sauce, including reduced sodium. Steak sauce. Fish sauce. Oyster sauce. Cocktail sauce. Horseradish that you find on the shelf. Regular ketchup and mustard. Meat flavorings and tenderizers. Bouillon cubes. Hot sauce. Tabasco sauce. Marinades. Taco seasonings. Relishes. Fats and Oils  Regular salad dressings. Salted butter. Margarine. Ghee. Bacon fat. Other  Potato and tortilla chips. Corn chips and puffs. Salted popcorn and pretzels. Canned or dried soups. Pizza. Frozen entrees and pot pies. The items listed above may not be a complete list of foods and beverages to avoid. Contact  your dietitian for more information.  This information is not intended to replace advice given to you by your health care provider. Make sure you discuss any questions you have with your health care provider. Document Released: 02/13/2002 Document Revised: 01/30/2016 Document Reviewed: 06/28/2013 Elsevier Interactive Patient Education  2017 Elsevier Inc.  -  Hypertension Hypertension is another name for high blood pressure. High blood pressure forces your heart to work harder to pump blood. A blood pressure reading has two numbers, which includes a higher number over a lower number (example: 110/72).  Follow these instructions at home:  Have your blood pressure rechecked by your doctor.  Only take medicine as told by your doctor. Follow the directions carefully. The medicine does not work as well if you skip doses. Skipping doses also puts you at risk for problems.  Do not smoke.  Monitor your blood pressure at home as told by your doctor. Contact a doctor if:  You think you are having a reaction to the medicine you are taking.  You have repeat headaches or feel dizzy.  You have puffiness (swelling) in your ankles.  You have trouble with your vision. Get help right away if:  You get a very bad headache and are confused.  You feel weak, numb, or faint.  You get chest or belly (abdominal) pain.  You throw up (vomit).  You cannot breathe very well. This information is not intended to replace advice given to you by your health care provider. Make sure you discuss any questions you have with your health care provider. Document Released: 02/10/2008 Document Revised: 01/30/2016 Document Reviewed: 06/16/2013 Elsevier Interactive Patient Education  2017 [employer].

## 2016-07-21 NOTE — Progress Notes (Signed)
Keith Liu, is a 40 y.o. male  KGM:[id number]  UYQ:[id number]  DOB - December 21, 1975  Chief Complaint  Patient presents with  . Diabetes        Subjective:   Keith Liu is a 40 y.o. male here today for a follow up visit, last seen 03/25/16, w/  htn, hld, IDdm2.  Pt has had rough few weeks, recently burried his nephew.  He has not been compliant w/ his diet.  Still smoking 1ppd.  Has been using lantus 15 units at night.   Patient has No headache, No chest pain, No abdominal pain - No Nausea, No new weakness tingling or numbness, No Cough - SOB.  Declined pneumovac and flu vac today.  No problems updated.  ALLERGIES: Allergies  Allergen Reactions  . Lisinopril Swelling    Angioedema of lower lip    PAST MEDICAL HISTORY: Past Medical History:  Diagnosis Date  . Diabetes mellitus without complication ([place])   . Hypertension     MEDICATIONS AT HOME: Prior to Admission medications   Medication Sig Start Date End Date Taking? Authorizing Provider  albuterol (VENTOLIN HFA) 108 (90 Base) MCG/ACT inhaler INHALE 2 PUFFS INTO THE LUNGS EVERY 6 HOURS AS NEEDED FOR WHEEZING OR SHORTNESS OF BREATH. 03/25/16   Maren Reamer, MD  amLODipine (NORVASC) 10 MG tablet Take 1 tablet (10 mg total) by mouth daily. 07/21/16   Maren Reamer, MD  aspirin (ASPIRIN CHILDRENS) 81 MG chewable tablet Chew 1 tablet (81 mg total) by mouth daily. 07/21/16   Maren Reamer, MD  atorvastatin (LIPITOR) 10 MG tablet Take 1 tablet (10 mg total) by mouth daily. 07/21/16   Maren Reamer, MD  Blood Glucose Monitoring Suppl (TRUE METRIX METER) w/Device KIT USE AS INSTRUCTED 09/04/15   Lance Bosch, NP  glipiZIDE (GLUCOTROL) 10 MG tablet Take 1 tablet (10 mg total) by mouth 2 (two) times daily before a meal. Must have office visit for refills 07/21/16   Maren Reamer, MD  glucose blood (TRUE METRIX BLOOD GLUCOSE TEST) test strip Use as instructed 07/21/16   Maren Reamer, MD    hydrochlorothiazide (HYDRODIURIL) 25 MG tablet Take 1 tablet (25 mg total) by mouth daily. 07/21/16   Maren Reamer, MD  Insulin Glargine (LANTUS SOLOSTAR) 100 UNIT/ML Solostar Pen Inject 18 Units into the skin daily at 10 pm. 07/21/16   Maren Reamer, MD  Insulin Pen Needle (ULTICARE MICRO PEN NEEDLES) 32G X 4 MM MISC 1 applicator by Does not apply route at bedtime. 07/21/16   Maren Reamer, MD  ketoconazole (NIZORAL) 2 % cream Apply 1 application topically 2 (two) times daily. Patient not taking: Reported on 07/21/2016 08/17/15   Alita Chyle Bacon Menshew, PA-C  metFORMIN (GLUCOPHAGE) 500 MG tablet Take 1 tablet (500 mg total) by mouth 2 (two) times daily with a meal. 03/25/16   Maren Reamer, MD  nicotine (NICODERM CQ - DOSED IN MG/24 HOURS) 14 mg/24hr patch Place 1 patch (14 mg total) onto the skin daily. 07/21/16   Maren Reamer, MD  nicotine (NICODERM CQ - DOSED IN MG/24 HOURS) 21 mg/24hr patch Place 1 patch (21 mg total) onto the skin daily. 07/21/16   Maren Reamer, MD  nicotine (NICODERM CQ - DOSED IN MG/24 HR) 7 mg/24hr patch Place 1 patch (7 mg total) onto the skin daily. 07/21/16   Maren Reamer, MD  TRUEPLUS LANCETS 26G MISC 1 each by Does not apply route every 8 (  eight) hours as needed. 03/25/16   Maren Reamer, MD     Objective:   Vitals:   07/21/16 1445  BP: (!) 143/90  Pulse: 88  Resp: 16  Temp: 97.9 F (36.6 C)  TempSrc: Oral  SpO2: 97%  Weight: 227 lb 3.2 oz (103.1 kg)    Exam General appearance : Awake, alert, not in any distress. Speech Clear. Not toxic looking, tired appearing, falling asleep during exam. HEENT: Atraumatic and Normocephalic, pupils equally reactive to light. Neck: supple, no JVD.   Chest:Good air entry bilaterally, no added sounds. CVS: S1 S2 regular, no murmurs/gallups or rubs. Abdomen: Bowel sounds active, abd obesity, Non tender and not distended with no gaurding, rigidity or rebound. Extremities: B/L Lower Ext shows no  edema, both legs are warm to touch Neurology: Awake alert, and oriented X 3, CN II-XII grossly intact, Non focal Skin:No Rash  Data Review Lab Results  Component Value Date   HGBA1C 10.0 03/25/2016   HGBA1C 9.80 03/12/2015   HGBA1C 10.3 12/07/2014    Depression screen PHQ 2/9 07/21/2016 03/25/2016 03/12/2015 12/12/2014 08/10/2014  Decreased Interest 1 0 0 0 0  Down, Depressed, Hopeless 0 0 0 0 0  PHQ - 2 Score 1 0 0 0 0      Assessment & Plan   1. Essential hypertension Usually he is very controlled, suspect dietary indiscretions playing huge part. - same norvasc 10 qd - hydrochlorothiazide (HYDRODIURIL) 25 MG tablet; Take 1 tablet (25 mg total) by mouth daily.  Dispense: 90 tablet; Refill: 3 - encouraged better diet /increase exercise. - consider hydralazine at next visit if still elevated  2. Hyperlipidemia, unspecified hyperlipidemia type - ASCVD risk > 20% over 10 years. - discussed this w/ pt. - recd asa 81 qd, ordered. - atorvastatin (LIPITOR) 10 MG tablet; Take 1 tablet (10 mg total) by mouth daily.  Dispense: 90 tablet; Refill: 3  3. Type 2 diabetes mellitus with microalbuminuria, with long-term current use of insulin ([place]) - ooc - again discussed long term consequences of uncontrolled diabetes, which are cumulative. - same glipizide and metformin, encouraged to take appropriately. He has been missing doses. - better diet control - increase lantus to 18 units qhs. Instructed pt on lantus titration every 3 days. - do cbgs at least 2 x day. - [facility] for dm chk - POCT glucose (manual entry) 237 - POCT glycosylated hemoglobin (Hb A1C) 10 (unchanged) - encouraged to get eyes checked, has no insurance  4. Suspected osa, but has no insurance - sleep study once gets financial aid  5. Financial aid encouraged.     Patient have been counseled extensively about nutrition and exercise  Return in about 3 months (around 10/21/2016), or if symptoms  worsen or fail to improve, for htn / dm.  The patient was given clear instructions to go to ER or return to medical center if symptoms don't improve, worsen or new problems develop. The patient verbalized understanding. The patient was told to call to get lab results if they haven't heard anything in the next week.   This note has been created with Surveyor, quantity. Any transcriptional errors are unintentional.   Maren Reamer, MD, [facility] [place], [place]   07/21/2016, 3:35 PM

## 2016-07-21 NOTE — Progress Notes (Signed)
Pt is in the office today for diabetes Pt states he is not in any pain Pt states he is taking medications without difficulty Pt states he has a spot on the back of his right leg Pt states he has also been having swelling in the right foot

## 2016-08-04 ENCOUNTER — Ambulatory Visit: Payer: Self-pay | Admitting: Pharmacist

## 2016-08-17 ENCOUNTER — Other Ambulatory Visit: Payer: Self-pay | Admitting: Internal Medicine

## 2016-09-02 MED FILL — AMLODIPINE BESYLATE 10 MG T: 10 | 30 days supply | Qty: 30 | Fill #0

## 2016-09-08 ENCOUNTER — Ambulatory Visit: Payer: Self-pay | Attending: Internal Medicine | Admitting: Pharmacist

## 2016-09-08 VITALS — BP 160/100

## 2016-09-08 DIAGNOSIS — Z79899 Other long term (current) drug therapy: Secondary | ICD-10-CM | POA: Insufficient documentation

## 2016-09-08 DIAGNOSIS — I10 Essential (primary) hypertension: Secondary | ICD-10-CM | POA: Insufficient documentation

## 2016-09-08 DIAGNOSIS — Z794 Long term (current) use of insulin: Secondary | ICD-10-CM | POA: Insufficient documentation

## 2016-09-08 DIAGNOSIS — E119 Type 2 diabetes mellitus without complications: Secondary | ICD-10-CM | POA: Insufficient documentation

## 2016-09-08 LAB — GLUCOSE, POCT (MANUAL RESULT ENTRY): POC GLUCOSE: 191 mg/dL — AB (ref 70–99)

## 2016-09-08 MED ORDER — METFORMIN HCL ER 500 MG PO TB24: ORAL_TABLET | ORAL | 2 refills | Status: DC

## 2016-09-08 MED FILL — glipiZIDE 10 MG TABS: 10 | 30 days supply | Qty: 60 | Fill #1

## 2016-09-08 MED FILL — METFORMIN HCL ER 500 MG TAB: 500 | 30 days supply | Qty: 60 | Fill #0

## 2016-09-08 MED FILL — ATORVASTATIN 10 MG TABLET: 10 | 30 days supply | Qty: 30 | Fill #1

## 2016-09-08 MED FILL — HYDROCHLOROTHIAZIDE 25 MG T: 25 | 30 days supply | Qty: 30 | Fill #1

## 2016-09-08 MED FILL — !LANTUS SOLOSTAR 100UNITS/M: 100 | 40 days supply | Qty: 6 | Fill #3

## 2016-09-08 NOTE — Progress Notes (Signed)
    S:    Patient arrives in good spirits.  Presents for diabetes evaluation, education, and management at the request of Dr. Julien NordmannLangeland. Patient was referred on 07/21/16.  Patient was last seen by Primary Care Provider on 07/21/16.   Patient denies adherence with medications. He only takes his metformin occasionally due to stomach upset and only takes his Lantus is his blood glucose is above 200. This is about 3 times per week. He has been out of his blood pressure medications for a week due to the holidays and not picking them up on time. He does report taking his glipizide every day.   Current diabetes medications include: metformin 500 mg BID, glipizide 10 mg BID, and Lantus 18 units daily  Patient is interested in starting to take cinnamon to lower his blood sugar. He has a family member who takes it and it works very well for him.   Current hypertension medications include: amlodipine 10 mg and HCTZ 25 mg daily.   Patient denies hypoglycemic events. Does reports relative hypoglycemia and felt sick when his blood glucose was 120 one morning.   Patient reported exercise habits: none   Patient reports nocturia.  Patient reports neuropathy. Patient denies visual changes. Patient reports self foot exams.    O:  Lab Results  Component Value Date   HGBA1C 10.0 07/21/2016   Vitals:   09/08/16 1146  BP: (!) 160/100    Home fasting CBG: 100s-200s 2 hour post-prandial/random CBG: 200s  POCT glucose 191   A/P: Diabetes longstanding currently UNcontrolled based on A1c of 10 and home readings. Patient denies hypoglycemic events and is able to verbalize appropriate hypoglycemia management plan. Patient denies adherence with medication. Control is suboptimal due to nonadherence to medications.  Change metformin immediate release to metformin XL 500 mg daily x 1 week and then increase to 2 tablets daily if tolerated. Continue glipizide. Continue Lantus for now, will focus on adherence  to metformin first and then start stressing the adherence to Lantus. If I lower his blood glucose too much right now, it will likely increase his nonadherence so will take it slowly.   Patient was interested in cinnamon, which is ok for him to try if he would like to. Instructed him to get cinnamon capsules over the counter at a drug store and not to take cinnamon from a spoon due to choking hazard. Patient verbalized understanding and will try OTC cinnamon.  Next A1C anticipated February 2018.    ASCVD risk greater than 7.5%. Continued Aspirin 81 mg and Continued atorvastatin 10 mg.   Hypertension longstanding currently UNcontrolled due to nonadherence to blood pressure medications x 1 week. Instructed patient to pick up his blood pressure medications today and to restart them immediately. Patient verbalized understanding.   Written patient instructions provided.  Total time in face to face counseling 25 minutes.   Follow up in Pharmacist Clinic Visit in 2 weeks

## 2016-09-08 NOTE — Patient Instructions (Addendum)
Thanks for coming to see me!  Stop your current metformin. Start the extended release metformin. Take 1 tablet every day with food and if you don't have any stomach upset after 1 week, increase to two tablets daily. This is a very important medicine - it lowers your blood sugar but also can help protect you the complications of diabetes.  You can try cinnamon but get the capsules from a drug store, do not just take regular cinnamon, as this can be dangerous due to choking.  Bring you log book or meter back in 2-3 weeks.

## 2016-10-27 ENCOUNTER — Other Ambulatory Visit: Payer: Self-pay | Admitting: Internal Medicine

## 2016-10-27 MED FILL — TRUEPLUS PEN NDL 32GX5/32": 32G X 4 MM | 30 days supply | Qty: 100 | Fill #1

## 2016-10-27 MED FILL — ?GLIPIZIDE 10 MG TABLET: 10 | 30 days supply | Qty: 60 | Fill #2

## 2016-10-27 MED FILL — ?ATORVASTATIN 10 MG TABLET: 10 | 30 days supply | Qty: 30 | Fill #2

## 2016-10-27 MED FILL — METFORMIN HCL ER 500 MG TAB: 500 | 30 days supply | Qty: 60 | Fill #1

## 2016-10-27 MED FILL — HYDROCHLOROTHIAZIDE 25 MG T: 25 | 30 days supply | Qty: 30 | Fill #2

## 2016-10-27 MED FILL — TRUEPLUS PEN NDL 32GX5/32: 32G X 4 MM | 30 days supply | Qty: 100 | Fill #1

## 2016-10-27 MED FILL — VENTOLIN HFA 90 MCG INHALER: 108 (90 BAS | 20 days supply | Qty: 18 | Fill #1

## 2016-10-27 MED FILL — AMLODIPINE BESYLATE 10 MG T: 10 | 30 days supply | Qty: 30 | Fill #1

## 2016-10-28 MED FILL — !LANTUS 100 UNITS/ML VIAL: 100 | 28 days supply | Qty: 3 | Fill #0

## 2016-12-07 MED FILL — HYDROCHLOROTHIAZIDE 25 MG T: 25 | 30 days supply | Qty: 30 | Fill #3

## 2016-12-07 MED FILL — ATORVASTATIN 10 MG TABLET: 10 | 30 days supply | Qty: 30 | Fill #3

## 2016-12-07 MED FILL — AMLODIPINE BESYLATE 10 MG T: 10 | 30 days supply | Qty: 30 | Fill #2

## 2016-12-07 MED FILL — METFORMIN HCL ER 500 MG TAB: 500 | 30 days supply | Qty: 60 | Fill #2

## 2016-12-07 MED FILL — ?GLIPIZIDE 10 MG TABLET: 10 | 30 days supply | Qty: 60 | Fill #3

## 2016-12-21 ENCOUNTER — Ambulatory Visit: Payer: Self-pay | Admitting: Internal Medicine

## 2016-12-29 ENCOUNTER — Ambulatory Visit: Payer: Self-pay | Attending: Internal Medicine | Admitting: Internal Medicine

## 2016-12-29 VITALS — BP 112/74 | HR 103 | Temp 97.9°F | Resp 18 | Ht 66.0 in | Wt 223.0 lb

## 2016-12-29 DIAGNOSIS — I10 Essential (primary) hypertension: Secondary | ICD-10-CM

## 2016-12-29 DIAGNOSIS — R0602 Shortness of breath: Secondary | ICD-10-CM | POA: Insufficient documentation

## 2016-12-29 DIAGNOSIS — Z7982 Long term (current) use of aspirin: Secondary | ICD-10-CM | POA: Insufficient documentation

## 2016-12-29 DIAGNOSIS — Z79899 Other long term (current) drug therapy: Secondary | ICD-10-CM | POA: Insufficient documentation

## 2016-12-29 DIAGNOSIS — R5383 Other fatigue: Secondary | ICD-10-CM | POA: Insufficient documentation

## 2016-12-29 DIAGNOSIS — E785 Hyperlipidemia, unspecified: Secondary | ICD-10-CM

## 2016-12-29 DIAGNOSIS — E119 Type 2 diabetes mellitus without complications: Secondary | ICD-10-CM | POA: Insufficient documentation

## 2016-12-29 DIAGNOSIS — F17210 Nicotine dependence, cigarettes, uncomplicated: Secondary | ICD-10-CM | POA: Insufficient documentation

## 2016-12-29 DIAGNOSIS — E1129 Type 2 diabetes mellitus with other diabetic kidney complication: Secondary | ICD-10-CM

## 2016-12-29 DIAGNOSIS — R21 Rash and other nonspecific skin eruption: Secondary | ICD-10-CM | POA: Insufficient documentation

## 2016-12-29 DIAGNOSIS — F17200 Nicotine dependence, unspecified, uncomplicated: Secondary | ICD-10-CM

## 2016-12-29 DIAGNOSIS — Z794 Long term (current) use of insulin: Secondary | ICD-10-CM

## 2016-12-29 DIAGNOSIS — R809 Proteinuria, unspecified: Secondary | ICD-10-CM

## 2016-12-29 LAB — GLUCOSE, POCT (MANUAL RESULT ENTRY): POC Glucose: 246 mg/dl — AB (ref 70–99)

## 2016-12-29 LAB — POCT GLYCOSYLATED HEMOGLOBIN (HGB A1C): Hemoglobin A1C: 10.7

## 2016-12-29 MED ORDER — ALBUTEROL SULFATE HFA 108 (90 BASE) MCG/ACT IN AERS: INHALATION_SPRAY | RESPIRATORY_TRACT | 3 refills | Status: DC

## 2016-12-29 MED ORDER — GLIPIZIDE 10 MG PO TABS: 10.0000 mg | ORAL_TABLET | Freq: Two times a day (BID) | ORAL | 3 refills | Status: DC

## 2016-12-29 MED ORDER — ATORVASTATIN CALCIUM 10 MG PO TABS: 10.0000 mg | ORAL_TABLET | Freq: Every day | ORAL | 3 refills | Status: DC

## 2016-12-29 MED ORDER — METFORMIN HCL ER 500 MG PO TB24: ORAL_TABLET | ORAL | 2 refills | Status: DC

## 2016-12-29 MED ORDER — AMLODIPINE BESYLATE 10 MG PO TABS: 10.0000 mg | ORAL_TABLET | Freq: Every day | ORAL | 4 refills | Status: DC

## 2016-12-29 MED ORDER — HYDROCHLOROTHIAZIDE 25 MG PO TABS: 25.0000 mg | ORAL_TABLET | Freq: Every day | ORAL | 3 refills | Status: DC

## 2016-12-29 MED ORDER — TRIAMCINOLONE ACETONIDE 0.5 % EX OINT: 1.0000 "application " | TOPICAL_OINTMENT | Freq: Two times a day (BID) | CUTANEOUS | 0 refills | Status: DC

## 2016-12-29 MED ORDER — INSULIN DETEMIR 100 UNIT/ML FLEXPEN: 25.0000 [IU] | PEN_INJECTOR | Freq: Every day | SUBCUTANEOUS | 11 refills | Status: DC

## 2016-12-29 MED ORDER — ASPIRIN 81 MG PO CHEW: 81.0000 mg | CHEWABLE_TABLET | Freq: Every day | ORAL | 3 refills | Status: DC

## 2016-12-29 MED ORDER — ALBUTEROL SULFATE HFA 108 (90 BASE) MCG/ACT IN AERS: 2.0000 | INHALATION_SPRAY | Freq: Four times a day (QID) | RESPIRATORY_TRACT | 2 refills | Status: DC | PRN

## 2016-12-29 MED ORDER — IPRATROPIUM-ALBUTEROL 0.5-2.5 (3) MG/3ML IN SOLN: 3.0000 mL | Freq: Four times a day (QID) | RESPIRATORY_TRACT | 1 refills | Status: AC | PRN

## 2016-12-29 NOTE — Patient Instructions (Addendum)
- [facility] /dm chk.   Check blood sugars on waking up daily., and at least 1-2 times rest of day.    Also check blood sugars about 2 hours after a meal and do this after different meals by rotation  Recommended blood sugar levels on waking up is 90-130 and about 2 hours after meal is 130-160  Please bring your blood sugar monitor to each visit, thank you  Restart exercise  Continue cholesterol med.   - Lantus Titration  Instructions:  IF AM blood sugar is  >150, increase lantus by 1 unit at night.  Continue to check sugars daily. On day 3, if blood sugar >150, continue to increase lantus by 1 unit.  If blood sugar < 70s, than need to reduce dose by 1 unit nightly.  Continue to check blood sugars in am daily and adjust per above.  -- please call us if you have any questions  -  For your tobacco abuse: Strongly recommend that he stop smoking back and all other inhaled products right away Call 1-800-Quit-Now to get free nicotine replacement from the state of [place]  -   Steps to Quit Smoking Smoking tobacco can be bad for your health. It can also affect almost every organ in your body. Smoking puts you and people around you at risk for many serious long-lasting (chronic) diseases. Quitting smoking is hard, but it is one of the best things that you can do for your health. It is never too late to quit. What are the benefits of quitting smoking? When you quit smoking, you lower your risk for getting serious diseases and conditions. They can include:  Lung cancer or lung disease.  Heart disease.  Stroke.  Heart attack.  Not being able to have children (infertility).  Weak bones (osteoporosis) and broken bones (fractures). If you have coughing, wheezing, and shortness of breath, those symptoms may get better when you quit. You may also get sick less often. If you are pregnant, quitting smoking can help to lower your chances of having a baby of low birth  weight. What can I do to help me quit smoking? Talk with your doctor about what can help you quit smoking. Some things you can do (strategies) include:  Quitting smoking totally, instead of slowly cutting back how much you smoke over a period of time.  Going to in-person counseling. You are more likely to quit if you go to many counseling sessions.  Using resources and support systems, such as:  Online chats with a Veterinary surgeon.  Phone quitlines.  Printed Materials engineer.  Support groups or group counseling.  Text messaging programs.  Mobile phone apps or applications.  Taking medicines. Some of these medicines may have nicotine in them. If you are pregnant or breastfeeding, do not take any medicines to quit smoking unless your doctor says it is okay. Talk with your doctor about counseling or other things that can help you. Talk with your doctor about using more than one strategy at the same time, such as taking medicines while you are also going to in-person counseling. This can help make quitting easier. What things can I do to make it easier to quit? Quitting smoking might feel very hard at first, but there is a lot that you can do to make it easier. Take these steps:  Talk to your family and friends. Ask them to support and encourage you.  Call phone quitlines, reach out to support groups, or  work with a Veterinary surgeon.  Ask people who smoke to not smoke around you.  Avoid places that make you want (trigger) to smoke, such as:  Bars.  Parties.  Smoke-break areas at work.  Spend time with people who do not smoke.  Lower the stress in your life. Stress can make you want to smoke. Try these things to help your stress:  Getting regular exercise.  Deep-breathing exercises.  Yoga.  Meditating.  Doing a body scan. To do this, close your eyes, focus on one area of your body at a time from head to toe, and notice which parts of your body are tense. Try to relax the muscles  in those areas.  Download or buy apps on your mobile phone or tablet that can help you stick to your quit plan. There are many free apps, such as QuitGuide from the [employer] developer for Disease Control and Prevention). You can find more support from smokefree.gov and other websites. This information is not intended to replace advice given to you by your health care provider. Make sure you discuss any questions you have with your health care provider. Document Released: 06/20/2009 Document Revised: 04/21/2016 Document Reviewed: 01/08/2015 Elsevier Interactive Patient Education  2017 Elsevier Inc. -   Low-Sodium Eating Plan Sodium, which is an element that makes up salt, helps you maintain a healthy balance of fluids in your body. Too much sodium can increase your blood pressure and cause fluid and waste to be held in your body. Your health care provider or dietitian may recommend following this plan if you have high blood pressure (hypertension), kidney disease, liver disease, or heart failure. Eating less sodium can help lower your blood pressure, reduce swelling, and protect your heart, liver, and kidneys. What are tips for following this plan? General guidelines   Most people on this plan should limit their sodium intake to 1,500-2,000 mg (milligrams) of sodium each day. Reading food labels   The Nutrition Facts label lists the amount of sodium in one serving of the food. If you eat more than one serving, you must multiply the listed amount of sodium by the number of servings.  Choose foods with less than 140 mg of sodium per serving.  Avoid foods with 300 mg of sodium or more per serving. Shopping   Look for lower-sodium products, often labeled as "low-sodium" or "no salt added."  Always check the sodium content even if foods are labeled as "unsalted" or "no salt added".  Buy fresh foods.  Avoid canned foods and premade or frozen meals.  Avoid canned, cured, or processed meats  Buy  breads that have less than 80 mg of sodium per slice. Cooking   Eat more home-cooked food and less restaurant, buffet, and fast food.  Avoid adding salt when cooking. Use salt-free seasonings or herbs instead of table salt or sea salt. Check with your health care provider or pharmacist before using salt substitutes.  Cook with plant-based oils, such as canola, sunflower, or olive oil. Meal planning   When eating at a restaurant, ask that your food be prepared with less salt or no salt, if possible.  Avoid foods that contain MSG (monosodium glutamate). MSG is sometimes added to [place] food, bouillon, and some canned foods. What foods are recommended? The items listed may not be a complete list. Talk with your dietitian about what dietary choices are best for you. Grains  Low-sodium cereals, including oats, puffed wheat and rice, and shredded wheat. Low-sodium crackers. Unsalted rice.  Unsalted pasta. Low-sodium bread. Whole-grain breads and whole-grain pasta. Vegetables  Fresh or frozen vegetables. "No salt added" canned vegetables. "No salt added" tomato sauce and paste. Low-sodium or reduced-sodium tomato and vegetable juice. Fruits  Fresh, frozen, or canned fruit. Fruit juice. Meats and other protein foods  Fresh or frozen (no salt added) meat, poultry, seafood, and fish. Low-sodium canned tuna and salmon. Unsalted nuts. Dried peas, beans, and lentils without added salt. Unsalted canned beans. Eggs. Unsalted nut butters. Dairy  Milk. Soy milk. Cheese that is naturally low in sodium, such as ricotta cheese, fresh mozzarella, or Swiss cheese Low-sodium or reduced-sodium cheese. Cream cheese. Yogurt. Fats and oils  Unsalted butter. Unsalted margarine with no trans fat. Vegetable oils such as canola or olive oils. Seasonings and other foods  Fresh and dried herbs and spices. Salt-free seasonings. Low-sodium mustard and ketchup. Sodium-free salad dressing. Sodium-free light mayonnaise. Fresh  or refrigerated horseradish. Lemon juice. Vinegar. Homemade, reduced-sodium, or low-sodium soups. Unsalted popcorn and pretzels. Low-salt or salt-free chips. What foods are not recommended? The items listed may not be a complete list. Talk with your dietitian about what dietary choices are best for you. Grains  Instant hot cereals. Bread stuffing, pancake, and biscuit mixes. Croutons. Seasoned rice or pasta mixes. Noodle soup cups. Boxed or frozen macaroni and cheese. Regular salted crackers. Self-rising flour. Vegetables  Sauerkraut, pickled vegetables, and relishes. Olives. [place] fries. Onion rings. Regular canned vegetables (not low-sodium or reduced-sodium). Regular canned tomato sauce and paste (not low-sodium or reduced-sodium). Regular tomato and vegetable juice (not low-sodium or reduced-sodium). Frozen vegetables in sauces. Meats and other protein foods  Meat or fish that is salted, canned, smoked, spiced, or pickled. Bacon, ham, sausage, hotdogs, corned beef, chipped beef, packaged lunch meats, salt pork, jerky, pickled herring, anchovies, regular canned tuna, sardines, salted nuts. Dairy  Processed cheese and cheese spreads. Cheese curds. Blue cheese. Feta cheese. String cheese. Regular cottage cheese. Buttermilk. Canned milk. Fats and oils  Salted butter. Regular margarine. Ghee. Bacon fat. Seasonings and other foods  Onion salt, garlic salt, seasoned salt, table salt, and sea salt. Canned and packaged gravies. Worcestershire sauce. Tartar sauce. Barbecue sauce. Teriyaki sauce. Soy sauce, including reduced-sodium. Steak sauce. Fish sauce. Oyster sauce. Cocktail sauce. Horseradish that you find on the shelf. Regular ketchup and mustard. Meat flavorings and tenderizers. Bouillon cubes. Hot sauce and Tabasco sauce. Premade or packaged marinades. Premade or packaged taco seasonings. Relishes. Regular salad dressings. Salsa. Potato and tortilla chips. Corn chips and puffs. Salted popcorn and  pretzels. Canned or dried soups. Pizza. Frozen entrees and pot pies. Summary  Eating less sodium can help lower your blood pressure, reduce swelling, and protect your heart, liver, and kidneys.  Most people on this plan should limit their sodium intake to 1,500-2,000 mg (milligrams) of sodium each day.  Canned, boxed, and frozen foods are high in sodium. Restaurant foods, fast foods, and pizza are also very high in sodium. You also get sodium by adding salt to food.  Try to cook at home, eat more fresh fruits and vegetables, and eat less fast food, canned, processed, or prepared foods. This information is not intended to replace advice given to you by your health care provider. Make sure you discuss any questions you have with your health care provider. Document Released: 02/13/2002 Document Revised: 08/17/2016 Document Reviewed: 08/17/2016 Elsevier Interactive Patient Education  2017 Elsevier Inc.  - Diabetes Mellitus and Food It is important for you to manage your blood sugar (glucose) level.  Your blood glucose level can be greatly affected by what you eat. Eating healthier foods in the appropriate amounts throughout the day at about the same time each day will help you control your blood glucose level. It can also help slow or prevent worsening of your diabetes mellitus. Healthy eating may even help you improve the level of your blood pressure and reach or maintain a healthy weight. General recommendations for healthful eating and cooking habits include:  Eating meals and snacks regularly. Avoid going long periods of time without eating to lose weight.  Eating a diet that consists mainly of plant-based foods, such as fruits, vegetables, nuts, legumes, and whole grains.  Using low-heat cooking methods, such as baking, instead of high-heat cooking methods, such as deep frying. Work with your dietitian to make sure you understand how to use the Nutrition Facts information on food  labels. How can food affect me? Carbohydrates  Carbohydrates affect your blood glucose level more than any other type of food. Your dietitian will help you determine how many carbohydrates to eat at each meal and teach you how to count carbohydrates. Counting carbohydrates is important to keep your blood glucose at a healthy level, especially if you are using insulin or taking certain medicines for diabetes mellitus. Alcohol  Alcohol can cause sudden decreases in blood glucose (hypoglycemia), especially if you use insulin or take certain medicines for diabetes mellitus. Hypoglycemia can be a life-threatening condition. Symptoms of hypoglycemia (sleepiness, dizziness, and disorientation) are similar to symptoms of having too much alcohol. If your health care provider has given you approval to drink alcohol, do so in moderation and use the following guidelines:  Women should not have more than one drink per day, and men should not have more than two drinks per day. One drink is equal to:  12 oz of beer.  5 oz of wine.  1 oz of hard liquor.  Do not drink on an empty stomach.  Keep yourself hydrated. Have water, diet soda, or unsweetened iced tea.  Regular soda, juice, and other mixers might contain a lot of carbohydrates and should be counted. What foods are not recommended? As you make food choices, it is important to remember that all foods are not the same. Some foods have fewer nutrients per serving than other foods, even though they might have the same number of calories or carbohydrates. It is difficult to get your body what it needs when you eat foods with fewer nutrients. Examples of foods that you should avoid that are high in calories and carbohydrates but low in nutrients include:  Trans fats (most processed foods list trans fats on the Nutrition Facts label).  Regular soda.  Juice.  Candy.  Sweets, such as cake, pie, doughnuts, and cookies.  Fried foods. What foods can I  eat? Eat nutrient-rich foods, which will nourish your body and keep you healthy. The food you should eat also will depend on several factors, including:  The calories you need.  The medicines you take.  Your weight.  Your blood glucose level.  Your blood pressure level.  Your cholesterol level. You should eat a variety of foods, including:  Protein.  Lean cuts of meat.  Proteins low in saturated fats, such as fish, egg whites, and beans. Avoid processed meats.  Fruits and vegetables.  Fruits and vegetables that may help control blood glucose levels, such as apples, mangoes, and yams.  Dairy products.  Choose fat-free or low-fat dairy products, such as milk, yogurt,  and cheese.  Grains, bread, pasta, and rice.  Choose whole grain products, such as multigrain bread, whole oats, and brown rice. These foods may help control blood pressure.  Fats.  Foods containing healthful fats, such as nuts, avocado, olive oil, canola oil, and fish. Does everyone with diabetes mellitus have the same meal plan? Because every person with diabetes mellitus is different, there is not one meal plan that works for everyone. It is very important that you meet with a dietitian who will help you create a meal plan that is just right for you. This information is not intended to replace advice given to you by your health care provider. Make sure you discuss any questions you have with your health care provider. Document Released: 05/21/2005 Document Revised: 01/30/2016 Document Reviewed: 07/21/2013 Elsevier Interactive Patient Education  2017 [employer].

## 2016-12-29 NOTE — Progress Notes (Signed)
Keith Liu, is a 41 y.o. male  HQI:[id number]  XLK:[id number]  DOB - 1975/10/08  Chief Complaint  Patient presents with  . Shortness of Breath        Subjective:   Keith Liu is a 41 y.o. male here today for a follow up visit, last seen 07/21/17, for htn, dm, tob abuse. Of note, pt is still smoking but down to 1ppd from 2ppd. States he was not feeling well today, more tired, not sleeping well, but attributes to new baby in house.  He states he has been taking glipizide and metformin, but has been unable to get lantus for the last 1 month. Taking all his bp meds as prescribed.  Ran out of his home albuterol nebs, so getting more doe.   Co of rash on his neck region as well, itchy at times, no f/c/erythema.  Patient has No headache, No chest pain, No abdominal pain - No Nausea, No new weakness tingling or numbness, No Cough.  No problems updated.  ALLERGIES: Allergies  Allergen Reactions  . Lisinopril Swelling    Angioedema of lower lip    PAST MEDICAL HISTORY: Past Medical History:  Diagnosis Date  . Diabetes mellitus without complication ([place])   . Hypertension     MEDICATIONS AT HOME: Prior to Admission medications   Medication Sig Start Date End Date Taking? Authorizing Provider  albuterol (PROVENTIL HFA;VENTOLIN HFA) 108 (90 Base) MCG/ACT inhaler Inhale 2 puffs into the lungs every 6 (six) hours as needed for wheezing or shortness of breath. 12/29/16   Maren Reamer, MD  albuterol (VENTOLIN HFA) 108 (90 Base) MCG/ACT inhaler INHALE 2 PUFFS INTO THE LUNGS EVERY 6 HOURS AS NEEDED FOR WHEEZING OR SHORTNESS OF BREATH. 12/29/16   Maren Reamer, MD  amLODipine (NORVASC) 10 MG tablet Take 1 tablet (10 mg total) by mouth daily. 12/29/16   Maren Reamer, MD  aspirin (ASPIRIN CHILDRENS) 81 MG chewable tablet Chew 1 tablet (81 mg total) by mouth daily. 12/29/16   Maren Reamer, MD  atorvastatin (LIPITOR) 10 MG tablet Take 1 tablet (10 mg total) by mouth  daily. 12/29/16   Maren Reamer, MD  Blood Glucose Monitoring Suppl (TRUE METRIX METER) w/Device KIT USE AS INSTRUCTED 09/04/15   Lance Bosch, NP  glipiZIDE (GLUCOTROL) 10 MG tablet Take 1 tablet (10 mg total) by mouth 2 (two) times daily before a meal. Must have office visit for refills 12/29/16   Maren Reamer, MD  glucose blood (TRUE METRIX BLOOD GLUCOSE TEST) test strip Use as instructed 07/21/16   Maren Reamer, MD  hydrochlorothiazide (HYDRODIURIL) 25 MG tablet Take 1 tablet (25 mg total) by mouth daily. 12/29/16   Maren Reamer, MD  Insulin Detemir (LEVEMIR) 100 UNIT/ML Pen Inject 25 Units into the skin daily at 10 pm. 12/29/16   Maren Reamer, MD  Insulin Pen Needle (ULTICARE MICRO PEN NEEDLES) 32G X 4 MM MISC 1 applicator by Does not apply route at bedtime. 07/21/16   Maren Reamer, MD  ipratropium-albuterol (DUONEB) 0.5-2.5 (3) MG/3ML SOLN Take 3 mLs by nebulization every 6 (six) hours as needed (sob/doe). 12/29/16   Maren Reamer, MD  metFORMIN (GLUCOPHAGE XR) 500 MG 24 hr tablet Take 1 tablet daily x 1 week and if tolerated, increase to 2 tablets daily. 09/08/16   Tresa Garter, MD  nicotine (NICODERM CQ - DOSED IN MG/24 HOURS) 14 mg/24hr patch Place 1 patch (14 mg total) onto the skin  daily. 07/21/16   Maren Reamer, MD  nicotine (NICODERM CQ - DOSED IN MG/24 HOURS) 21 mg/24hr patch Place 1 patch (21 mg total) onto the skin daily. 07/21/16   Maren Reamer, MD  nicotine (NICODERM CQ - DOSED IN MG/24 HR) 7 mg/24hr patch Place 1 patch (7 mg total) onto the skin daily. 07/21/16   Maren Reamer, MD  triamcinolone ointment (KENALOG) 0.5 % Apply 1 application topically 2 (two) times daily. 12/29/16   Maren Reamer, MD  TRUEPLUS LANCETS 26G MISC 1 each by Does not apply route every 8 (eight) hours as needed. 03/25/16   Maren Reamer, MD     Objective:   Vitals:   12/29/16 1546  BP: 112/74  Pulse: (!) 103  Resp: 18  Temp: 97.9 F (36.6 C)  TempSrc:  Oral  SpO2: 95%  Weight: 223 lb (101.2 kg)  Height: '5\' 6"'  (1.676 m)    Exam General appearance : Awake, alert, not in any distress. Speech Clear. Not toxic looking, morbid obese, tired appearing. HEENT: Atraumatic and Normocephalic, pupils equally reactive to light. bilat TMs clear. Neck: supple, no JVD. No cervical lymphadenopathy.  Chest: diminished diffusely, but clear.  no added sounds. CVS: S1 S2 regular, no murmurs/gallups or rubs. Abdomen: Bowel sounds active, obese, Non tender and not distended with no gaurding, rigidity or rebound. Extremities: B/L Lower Ext shows no edema, both legs are warm to touch Neurology: Awake alert, and oriented X 3, CN II-XII grossly intact, Non focal Skin:No Rash  Data Review Lab Results  Component Value Date   HGBA1C 10.7 12/29/2016   HGBA1C 10.0 07/21/2016   HGBA1C 10.0 03/25/2016    Depression screen PHQ 2/9 07/21/2016 03/25/2016 03/12/2015 12/12/2014 08/10/2014  Decreased Interest 1 0 0 0 0  Down, Depressed, Hopeless 0 0 0 0 0  PHQ - 2 Score 1 0 0 0 0      Assessment & Plan   1. Essential hypertension Better controlled, continue low salt diet. - renewed norvasc 10 qd - hydrochlorothiazide (HYDRODIURIL) 25 MG tablet; Take 1 tablet (25 mg total) by mouth daily.  Dispense: 90 tablet; Refill: 3  2. Hyperlipidemia, unspecified hyperlipidemia type Continue statin, need to resume asa 81 qd for ascvd risk. - atorvastatin (LIPITOR) 10 MG tablet; Take 1 tablet (10 mg total) by mouth daily.  Dispense: 90 tablet; Refill: 3  3. Smoking For your tobacco abuse: Strongly recommend that he stop smoking back and all other inhaled products right away Call 1-800-Quit-Now to get free nicotine replacement from the state of [place]    4. Type 2 diabetes mellitus with microalbuminuria, with long-term current use of insulin (HCC) - may be contributing to fatigue, needs better control, increase exercise as able - dw q3 day levemir titration, he has been off  lantus for at least 1 month - levemir 25 qhs ordered, q3 titration instructions provided - continue glipizide 10bid and metformin 500bid - POCT A1C 10.9 - Glucose (CBG)  5. Fatigue Multifactorial, ooc dm, perhaps osa as well - recd financial packet- so can get sleep study, etc, - needs better glucose control. Pt will try to work on this, contracted to get it better before I see him next. - new baby in home, another reason to stop smoking  6. Doe, concerning for copd Still no financial aid duonebs rx, albuterol mdi rx, if cannot afford duonebs, than reg. Albuterol nebs  7. Financial aid    Patient have been counseled extensively about nutrition  and exercise  Return in about 6 weeks (around 02/09/2017) for w/ me, dm /htn.  The patient was given clear instructions to go to ER or return to medical center if symptoms don't improve, worsen or new problems develop. The patient verbalized understanding. The patient was told to call to get lab results if they haven't heard anything in the next week.   This note has been created with Surveyor, quantity. Any transcriptional errors are unintentional.   Maren Reamer, MD, [facility], [place]   12/29/2016, 4:29 PM

## 2017-01-06 MED FILL — LEVEMIR FLEXTOUCH 100 UNITS: 100 | 12 days supply | Qty: 3 | Fill #0

## 2017-01-06 MED FILL — ?GLIPIZIDE 10 MG TABLET: 10 | 30 days supply | Qty: 60 | Fill #0

## 2017-01-06 MED FILL — PROVENTIL HFA 90 MCG INH: 108 (90 BAS | 25 days supply | Qty: 7 | Fill #0

## 2017-01-06 MED FILL — AMLODIPINE BESYLATE 10 MG T: 10 | 30 days supply | Qty: 30 | Fill #0

## 2017-01-06 MED FILL — METFORMIN HCL ER 500 MG TAB: 500 | 30 days supply | Qty: 60 | Fill #0

## 2017-01-06 MED FILL — HYDROCHLOROTHIAZIDE 25 MG T: 25 | 30 days supply | Qty: 30 | Fill #0

## 2017-01-06 MED FILL — TRIAMCINOLONE 0.5% OINTMENT: 0.5 | 10 days supply | Qty: 30 | Fill #0

## 2017-01-06 MED FILL — IPRAT-ALBUT 0.5-3(2.5) MG/3: 0.5-2.5 (3) | 30 days supply | Qty: 360 | Fill #0

## 2017-01-06 MED FILL — ?ATORVASTATIN 10 MG TABLET: 10 | 30 days supply | Qty: 30 | Fill #0

## 2017-01-21 ENCOUNTER — Encounter: Payer: Self-pay | Admitting: Internal Medicine

## 2017-01-22 ENCOUNTER — Encounter: Payer: Self-pay | Admitting: Internal Medicine

## 2017-01-25 ENCOUNTER — Encounter: Payer: Self-pay | Admitting: Internal Medicine

## 2017-01-27 ENCOUNTER — Ambulatory Visit: Payer: Self-pay | Admitting: Internal Medicine

## 2017-02-02 MED FILL — ?AMLODIPINE BESYLATE 10 MG: 10 | 30 days supply | Qty: 30 | Fill #1

## 2017-02-02 MED FILL — HYDROCHLOROTHIAZIDE 25 MG T: 25 | 30 days supply | Qty: 30 | Fill #1

## 2017-02-08 MED FILL — IPRAT-ALBUT 0.5-3(2.5) MG/3: 0.5-2.5 (3) | 30 days supply | Qty: 360 | Fill #1

## 2017-02-08 MED FILL — ATORVASTATIN 10 MG TABLET: 10 | 30 days supply | Qty: 30 | Fill #1

## 2017-02-08 MED FILL — LEVEMIR FLEXTOUCH 100 UNITS: 100 | 12 days supply | Qty: 3 | Fill #1

## 2017-02-08 MED FILL — PROVENTIL HFA 108 (90 BASE): 108 (90 BAS | 25 days supply | Qty: 7 | Fill #1

## 2017-02-08 MED FILL — METFORMIN HCL ER 500 MG TAB: 500 | 30 days supply | Qty: 60 | Fill #1

## 2017-02-08 MED FILL — ?GLIPIZIDE 10 MG TABLET: 10 | 30 days supply | Qty: 60 | Fill #1

## 2017-02-24 ENCOUNTER — Ambulatory Visit: Payer: Self-pay | Admitting: Internal Medicine

## 2017-03-11 MED FILL — ATORVASTATIN 10 MG TABLET: 10 | 30 days supply | Qty: 30 | Fill #2

## 2017-03-11 MED FILL — METFORMIN HCL ER 500 MG TAB: 500 | 30 days supply | Qty: 60 | Fill #2

## 2017-03-11 MED FILL — HYDROCHLOROTHIAZIDE 25 MG T: 25 | 30 days supply | Qty: 30 | Fill #2

## 2017-03-11 MED FILL — ?AMLODIPINE BESYLATE 10 MG: 10 | 30 days supply | Qty: 30 | Fill #2

## 2017-03-11 MED FILL — ?GLIPIZIDE 10 MG TABLET: 10 | 30 days supply | Qty: 60 | Fill #2

## 2017-03-26 ENCOUNTER — Ambulatory Visit: Payer: Self-pay | Admitting: Family Medicine

## 2017-05-03 MED FILL — AMLODIPINE BESYLATE 10 MG T: 10 | 30 days supply | Qty: 30 | Fill #3

## 2017-05-03 MED FILL — !LANTUS SOLOSTAR 100UNITS/M: 100 | 16 days supply | Qty: 3 | Fill #0

## 2017-05-03 MED FILL — ?GLIPIZIDE 10 MG TABLET: 10 | 30 days supply | Qty: 60 | Fill #3

## 2017-05-03 MED FILL — ?ATORVASTATIN 10 MG TABLET: 10 | 30 days supply | Qty: 30 | Fill #3

## 2017-05-03 MED FILL — HYDROCHLOROTHIAZIDE 25 MG T: 25 | 30 days supply | Qty: 30 | Fill #3

## 2017-06-16 ENCOUNTER — Ambulatory Visit: Payer: Self-pay | Admitting: Family Medicine

## 2017-06-23 MED FILL — !VENTOLIN HFA INHALER: 108 (90 BAS | 25 days supply | Qty: 18 | Fill #0

## 2017-06-24 MED FILL — ?ATORVASTATIN 10 MG TABLET: 10 | 30 days supply | Qty: 30 | Fill #4

## 2017-06-24 MED FILL — HYDROCHLOROTHIAZIDE 25 MG T: 25 | 30 days supply | Qty: 30 | Fill #4

## 2017-06-24 MED FILL — AMLODIPINE BESYLATE 10 MG T: 10 | 30 days supply | Qty: 30 | Fill #4

## 2017-06-24 MED FILL — ?GLIPIZIDE 10 MG TABLET: 10 | 30 days supply | Qty: 60 | Fill #4

## 2017-07-26 ENCOUNTER — Ambulatory Visit: Payer: Self-pay | Admitting: Family Medicine

## 2017-08-02 MED FILL — !VENTOLIN HFA INHALER: 108 (90 BAS | 25 days supply | Qty: 18 | Fill #1

## 2017-08-02 MED FILL — AMLODIPINE BESYLATE 10 MG T: 10 | 30 days supply | Qty: 30 | Fill #5

## 2017-08-02 MED FILL — ?ATORVASTATIN 10 MG TABLET: 10 | 30 days supply | Qty: 30 | Fill #5

## 2017-08-02 MED FILL — HYDROCHLOROTHIAZIDE 25 MG T: 25 | 30 days supply | Qty: 30 | Fill #5

## 2017-08-02 MED FILL — ?GLIPIZIDE 10 MG TABLET: 10 | 30 days supply | Qty: 60 | Fill #5

## 2017-08-05 ENCOUNTER — Other Ambulatory Visit: Payer: Self-pay | Admitting: Family Medicine

## 2017-08-05 MED FILL — !LEVEMIR FLEXPEN 100UNITS/M: 100U/ML (3) | 12 days supply | Qty: 3 | Fill #2

## 2017-09-13 MED FILL — ?GLIPIZIDE 10 MG TABLET: 10 | 30 days supply | Qty: 60 | Fill #6

## 2017-09-13 MED FILL — HYDROCHLOROTHIAZIDE 25 MG T: 25 | 30 days supply | Qty: 30 | Fill #6

## 2017-09-13 MED FILL — ?ATORVASTATIN 10 MG TABLET: 10 | 30 days supply | Qty: 30 | Fill #6

## 2017-09-13 MED FILL — AMLODIPINE BESYLATE 10 MG T: 10 | 30 days supply | Qty: 30 | Fill #6

## 2017-09-17 ENCOUNTER — Encounter: Payer: Self-pay | Admitting: Nurse Practitioner

## 2017-09-17 ENCOUNTER — Ambulatory Visit: Payer: Self-pay | Attending: Nurse Practitioner | Admitting: Nurse Practitioner

## 2017-09-17 VITALS — BP 118/76 | HR 103 | Temp 97.9°F | Ht 66.0 in | Wt 217.4 lb

## 2017-09-17 DIAGNOSIS — E1165 Type 2 diabetes mellitus with hyperglycemia: Secondary | ICD-10-CM | POA: Insufficient documentation

## 2017-09-17 DIAGNOSIS — Z79899 Other long term (current) drug therapy: Secondary | ICD-10-CM | POA: Insufficient documentation

## 2017-09-17 DIAGNOSIS — Z0189 Encounter for other specified special examinations: Secondary | ICD-10-CM | POA: Insufficient documentation

## 2017-09-17 DIAGNOSIS — Z794 Long term (current) use of insulin: Secondary | ICD-10-CM | POA: Insufficient documentation

## 2017-09-17 DIAGNOSIS — F17200 Nicotine dependence, unspecified, uncomplicated: Secondary | ICD-10-CM

## 2017-09-17 DIAGNOSIS — E1129 Type 2 diabetes mellitus with other diabetic kidney complication: Secondary | ICD-10-CM | POA: Insufficient documentation

## 2017-09-17 DIAGNOSIS — R809 Proteinuria, unspecified: Secondary | ICD-10-CM | POA: Insufficient documentation

## 2017-09-17 DIAGNOSIS — E1169 Type 2 diabetes mellitus with other specified complication: Secondary | ICD-10-CM | POA: Insufficient documentation

## 2017-09-17 DIAGNOSIS — E785 Hyperlipidemia, unspecified: Secondary | ICD-10-CM | POA: Insufficient documentation

## 2017-09-17 DIAGNOSIS — Z7982 Long term (current) use of aspirin: Secondary | ICD-10-CM | POA: Insufficient documentation

## 2017-09-17 DIAGNOSIS — I10 Essential (primary) hypertension: Secondary | ICD-10-CM | POA: Insufficient documentation

## 2017-09-17 LAB — POCT URINALYSIS DIPSTICK
BILIRUBIN UA: NEGATIVE
Glucose, UA: 500
KETONES UA: NEGATIVE
Leukocytes, UA: NEGATIVE
NITRITE UA: NEGATIVE
PH UA: 5.5 (ref 5.0–8.0)
PROTEIN UA: NEGATIVE
RBC UA: NEGATIVE
UROBILINOGEN UA: 0.2 U/dL

## 2017-09-17 LAB — POCT GLYCOSYLATED HEMOGLOBIN (HGB A1C): Hemoglobin A1C: 12.5

## 2017-09-17 LAB — GLUCOSE, POCT (MANUAL RESULT ENTRY)
POC GLUCOSE: 422 mg/dL — AB (ref 70–99)
POC GLUCOSE: 362 mg/dL — AB (ref 70–99)

## 2017-09-17 MED ORDER — TRUEPLUS LANCETS 26G MISC: 1.0000 | Freq: Three times a day (TID) | 12 refills | Status: AC | PRN

## 2017-09-17 MED ORDER — GLUCOSE BLOOD VI STRP: ORAL_STRIP | 12 refills | Status: DC

## 2017-09-17 MED ORDER — NICOTINE 14 MG/24HR TD PT24
14.0000 mg | MEDICATED_PATCH | Freq: Every day | TRANSDERMAL | 0 refills | Status: DC
Start: 2017-09-17 — End: 2018-04-28

## 2017-09-17 MED ORDER — INSULIN PEN NEEDLE 31G X 5 MM MISC: 1 refills | Status: DC

## 2017-09-17 MED ORDER — NICOTINE 7 MG/24HR TD PT24: 7.0000 mg | MEDICATED_PATCH | Freq: Every day | TRANSDERMAL | 0 refills | Status: DC

## 2017-09-17 MED ORDER — METFORMIN HCL ER 500 MG PO TB24: ORAL_TABLET | ORAL | 2 refills | Status: DC

## 2017-09-17 MED ORDER — INSULIN ASPART 100 UNIT/ML ~~LOC~~ SOLN
20.0000 [IU] | Freq: Once | SUBCUTANEOUS | Status: AC
Start: 2017-09-17 — End: 2017-09-17
  Administered 2017-09-17: 20 [IU] via SUBCUTANEOUS

## 2017-09-17 MED ORDER — TRUEPLUS LANCETS 26G MISC: 1.0000 | Freq: Three times a day (TID) | 12 refills | Status: DC | PRN

## 2017-09-17 MED ORDER — NICOTINE 21 MG/24HR TD PT24: 21.0000 mg | MEDICATED_PATCH | Freq: Every day | TRANSDERMAL | 0 refills | Status: DC

## 2017-09-17 MED ORDER — INSULIN GLARGINE 100 UNIT/ML SOLOSTAR PEN
25.0000 [IU] | PEN_INJECTOR | Freq: Every day | SUBCUTANEOUS | 99 refills | Status: DC
Start: 2017-09-17 — End: 2018-05-18

## 2017-09-17 MED ORDER — TRUE METRIX METER W/DEVICE KIT: PACK | 0 refills | Status: AC

## 2017-09-17 MED FILL — TRUEPLUS PEN NDL 31GX3/16": 31G X 5 MM | 30 days supply | Qty: 100 | Fill #0

## 2017-09-17 MED FILL — !LANTUS SOLOSTAR 100UNITS/M: 100 | 36 days supply | Qty: 9 | Fill #0

## 2017-09-17 MED FILL — TRUE METRIX TEST STRIP: 30 days supply | Qty: 100 | Fill #0

## 2017-09-17 MED FILL — NICOTINE 21 MG/24HR PATCH: 21 | 28 days supply | Qty: 28 | Fill #0

## 2017-09-17 MED FILL — TRUEplus LANCETS 28G MISC: 30 days supply | Qty: 100 | Fill #0

## 2017-09-17 MED FILL — METFORMIN HCL ER 500 MG TAB: 500 | 30 days supply | Qty: 60 | Fill #0

## 2017-09-17 MED FILL — !TRUE METRIX BLOOD GLUCOSE: 30 days supply | Qty: 1 | Fill #0

## 2017-09-17 MED FILL — TRUEPLUS PEN NDL 31GX3/16: 31G X 5 MM | 30 days supply | Qty: 100 | Fill #0

## 2017-09-17 NOTE — Progress Notes (Signed)
poct

## 2017-09-17 NOTE — Progress Notes (Signed)
Assessment & Plan:  [name] was seen today for establish care.  Diagnoses and all orders for this visit:  Type 2 diabetes mellitus with microalbuminuria, with long-term current use of insulin (HCC) -     Glucose (CBG) -     HgB A1c -     Urinalysis Dipstick -     insulin aspart (novoLOG) injection 20 Units -     metFORMIN (GLUCOPHAGE XR) 500 MG 24 hr tablet; Take 1 tablet bid -     Blood Glucose Monitoring Suppl (TRUE METRIX METER) w/Device KIT; USE AS INSTRUCTED -     glucose blood (TRUE METRIX BLOOD GLUCOSE TEST) test strip; Use as instructed -     TRUEPLUS LANCETS 26G MISC; 1 each by Does not apply route every 8 (eight) hours as needed. -     Microalbumin / creatinine urine ratio -     Ambulatory referral to Podiatry -     Glucose (CBG) -     Ambulatory referral to Ophthalmology Diabetes is poorly controlled. Advised patient to keep a fasting blood sugar log fast, 2 hours post lunch and bedtime which will be reviewed at the next office visit.  Tobacco dependency Keith Liu was counseled on the dangers of tobacco use, and was advised to quit. Reviewed strategies to maximize success, including removing cigarettes and smoking materials from environment, stress management and support of family/friends as well as pharmacological alternatives including: Wellbutrin, Chantix, Nicotine patch, Nicotine gum or lozenges. Smoking cessation support: smoking cessation hotline: 1-800-QUIT-NOW.  Smoking cessation classes are also available through [facility]. Call [phone] or visit our website at [email or web address]   Spent 5 minutes counseling on smoking cessation and patient is ready to quit. -     nicotine (NICODERM CQ - DOSED IN MG/24 HOURS) 21 mg/24hr patch; Place 1 patch (21 mg total) onto the skin daily. -     nicotine (NICODERM CQ - DOSED IN MG/24 HOURS) 14 mg/24hr patch; Place 1 patch (14 mg total) onto the skin daily. -     nicotine (NICODERM CQ - DOSED IN MG/24  HR) 7 mg/24hr patch; Place 1 patch (7 mg total) onto the skin daily.  Essential hypertension Continue all antihypertensives as prescribed.  Remember to bring in your blood pressure log with you for your follow up appointment.  DASH/Mediterranean Diets are healthier choices for HTN.   Hyperlipidemia associated with type 2 diabetes mellitus ([name]) Please call patient: Tests show increased cholesterol/lipid levels. Will need to start on statin or cholesterol/lipid lowering medication. Prescription has been sent to the pharmacy. Patient should continue to work on low fat, heart healthy diet and participate in regular aerobic exercise program to control as well. Exercise at least 150 minutes per week.    Patient has been counseled on age-appropriate routine health concerns for screening and prevention. These are reviewed and up-to-date. Referrals have been placed accordingly. Immunizations are up-to-date or declined.    Subjective:   Chief Complaint  Patient presents with  . Establish Care    Patient is here to establish care. Patient stated he need medication refills. Patient would like to have his right foot check out and have a skin problem to speak with the pcp.   HPI Keith Liu 42 y.o. male presents to office today to establish care as a new patient.    Diabetes Mellitus  He is out of his Levemir Pen. Will re order a lantus pen due to costs. He is not checking  his blood sugars. Reports he does not know where his meter is. He continues to smoke. Has some diabetic skin changes to his right foot. There are no current signs of skin breakdown or infection. Diabetes is poorly controlled. He is not adherent to an ADA diet. He has had diabetes nutrition in the past. A1c is trending up. He is aware of the complications that can occur from poorly controlled diabetes such as loss of limbs, renal failure, stroke, blindness, heart attack and stroke. He wants to quit smoking and "eat right" Lab  Results  Component Value Date   HGBA1C 12.5 09/17/2017   Essential Hypertension Blood pressure well controlled. Endorses medication compliance. He is not diet compliant. He reports his wife cooks healthy however he tends to cook unhealthy. Denies chest pain, shortness of breath, palpitations, lightheadedness, dizziness, headaches or BLE edema.  BP Readings from Last 3 Encounters:  09/17/17 118/76  12/29/16 112/74  09/08/16 (!) 160/100    Review of Systems  Constitutional: Negative for fever, malaise/fatigue and weight loss.  HENT: Negative.  Negative for nosebleeds.   Eyes: Negative.  Negative for blurred vision, double vision and photophobia.  Respiratory: Negative.  Negative for cough and shortness of breath.   Cardiovascular: Negative.  Negative for chest pain, palpitations and leg swelling.  Gastrointestinal: Negative.  Negative for abdominal pain, constipation, diarrhea, heartburn, nausea and vomiting.  Musculoskeletal: Negative.  Negative for myalgias.  Neurological: Positive for tingling and sensory change (neuropathy). Negative for dizziness, focal weakness, seizures and headaches.  Endo/Heme/Allergies: Negative for environmental allergies.  Psychiatric/Behavioral: Negative.  Negative for suicidal ideas.    Past Medical History:  Diagnosis Date  . Diabetes mellitus without complication ([place])   . Hypertension     Past Surgical History:  Procedure Laterality Date  . KNEE SURGERY Left     Family History  Problem Relation Age of Onset  . COPD Mother   . Diabetes Mother   . Hypertension Mother     Social History Reviewed with no changes to be made today.   Outpatient Medications Prior to Visit  Medication Sig Dispense Refill  . amLODipine (NORVASC) 10 MG tablet Take 1 tablet (10 mg total) by mouth daily. 90 tablet 4  . atorvastatin (LIPITOR) 10 MG tablet Take 1 tablet (10 mg total) by mouth daily. 90 tablet 3  . glipiZIDE (GLUCOTROL) 10 MG tablet Take 1 tablet (10  mg total) by mouth 2 (two) times daily before a meal. Must have office visit for refills 180 tablet 3  . hydrochlorothiazide (HYDRODIURIL) 25 MG tablet Take 1 tablet (25 mg total) by mouth daily. 90 tablet 3  . albuterol (PROVENTIL HFA;VENTOLIN HFA) 108 (90 Base) MCG/ACT inhaler Inhale 2 puffs into the lungs every 6 (six) hours as needed for wheezing or shortness of breath. (Patient not taking: Reported on 09/17/2017) 1 Inhaler 2  . aspirin (ASPIRIN CHILDRENS) 81 MG chewable tablet Chew 1 tablet (81 mg total) by mouth daily. (Patient not taking: Reported on 09/17/2017) 90 tablet 3  . Insulin Pen Needle (ULTICARE MICRO PEN NEEDLES) 32G X 4 MM MISC 1 applicator by Does not apply route at bedtime. (Patient not taking: Reported on 09/17/2017) 100 each 2  . ipratropium-albuterol (DUONEB) 0.5-2.5 (3) MG/3ML SOLN Take 3 mLs by nebulization every 6 (six) hours as needed (sob/doe). (Patient not taking: Reported on 09/17/2017) 360 mL 1  . albuterol (VENTOLIN HFA) 108 (90 Base) MCG/ACT inhaler INHALE 2 PUFFS INTO THE LUNGS EVERY 6 HOURS AS NEEDED FOR WHEEZING  OR SHORTNESS OF BREATH. (Patient not taking: Reported on 09/17/2017) 18 g 3  . Blood Glucose Monitoring Suppl (TRUE METRIX METER) w/Device KIT USE AS INSTRUCTED (Patient not taking: Reported on 09/17/2017) 1 kit 0  . glucose blood (TRUE METRIX BLOOD GLUCOSE TEST) test strip Use as instructed (Patient not taking: Reported on 09/17/2017) 100 each 12  . Insulin Detemir (LEVEMIR) 100 UNIT/ML Pen Inject 25 Units into the skin daily at 10 pm. (Patient not taking: Reported on 09/17/2017) 15 mL 11  . metFORMIN (GLUCOPHAGE XR) 500 MG 24 hr tablet Take 1 tablet bid (Patient not taking: Reported on 09/17/2017) 60 tablet 2  . nicotine (NICODERM CQ - DOSED IN MG/24 HOURS) 14 mg/24hr patch Place 1 patch (14 mg total) onto the skin daily. (Patient not taking: Reported on 09/17/2017) 28 patch 0  . nicotine (NICODERM CQ - DOSED IN MG/24 HOURS) 21 mg/24hr patch Place 1 patch (21 mg  total) onto the skin daily. (Patient not taking: Reported on 09/17/2017) 28 patch 0  . nicotine (NICODERM CQ - DOSED IN MG/24 HR) 7 mg/24hr patch Place 1 patch (7 mg total) onto the skin daily. (Patient not taking: Reported on 09/17/2017) 28 patch 0  . triamcinolone ointment (KENALOG) 0.5 % Apply 1 application topically 2 (two) times daily. (Patient not taking: Reported on 09/17/2017) 30 g 0  . TRUEPLUS LANCETS 26G MISC 1 each by Does not apply route every 8 (eight) hours as needed. (Patient not taking: Reported on 09/17/2017) 100 each 12   No facility-administered medications prior to visit.     Allergies  Allergen Reactions  . Lisinopril Swelling    Angioedema of lower lip       Objective:    BP 118/76 (BP Location: Left Arm, Patient Position: Sitting, Cuff Size: Normal)   Pulse (!) 103   Temp 97.9 F (36.6 C) (Oral)   Ht 5' 6" (1.676 m)   Wt 217 lb 6.4 oz (98.6 kg)   SpO2 95%   BMI 35.09 kg/m  Wt Readings from Last 3 Encounters:  09/17/17 217 lb 6.4 oz (98.6 kg)  12/29/16 223 lb (101.2 kg)  07/21/16 227 lb 3.2 oz (103.1 kg)    Physical Exam  Constitutional: He is oriented to person, place, and time. He appears well-developed and well-nourished. He is cooperative.  HENT:  Head: Normocephalic and atraumatic.  Eyes: EOM are normal.  Neck: Normal range of motion.  Cardiovascular: Normal rate, regular rhythm, normal heart sounds and intact distal pulses. Exam reveals no gallop and no friction rub.  No murmur heard. Pulmonary/Chest: Effort normal and breath sounds normal. No tachypnea. No respiratory distress. He has no decreased breath sounds. He has no wheezes. He has no rhonchi. He has no rales. He exhibits no tenderness.  Abdominal: Soft. Bowel sounds are normal.  Musculoskeletal: Normal range of motion. He exhibits no edema.  Neurological: He is alert and oriented to person, place, and time. Coordination normal.  Skin: Skin is warm and dry.  Sensory exam of the foot is  normal, tested with the monofilament. Good pulses,  good peripheral pulses.  Psychiatric: He has a normal mood and affect. His behavior is normal. Judgment and thought content normal.  Nursing note and vitals reviewed.      Patient has been counseled extensively about nutrition and exercise as well as the importance of adherence with medications and regular follow-up. The patient was given clear instructions to go to ER or return to medical center if symptoms don't improve,  worsen or new problems develop. The patient verbalized understanding.   Follow-up: Return in about 3 months (around 12/16/2017) for FASTING labs and DM/HTN needs 30 min.   Gildardo Pounds, FNP-BC [facility] [place], [place]   09/19/2017, 2:18 PM

## 2017-09-19 ENCOUNTER — Encounter: Payer: Self-pay | Admitting: Nurse Practitioner

## 2017-09-22 LAB — MICROALBUMIN / CREATININE URINE RATIO
CREATININE, UR: 37.4 mg/dL
MICROALB/CREAT RATIO: 132.4 mg/g{creat} — AB (ref 0.0–30.0)
MICROALBUM., U, RANDOM: 49.5 ug/mL

## 2017-09-27 ENCOUNTER — Telehealth: Payer: Self-pay

## 2017-09-27 ENCOUNTER — Other Ambulatory Visit: Payer: Self-pay | Admitting: Nurse Practitioner

## 2017-09-27 MED ORDER — LOSARTAN POTASSIUM 25 MG PO TABS: 25.0000 mg | ORAL_TABLET | Freq: Every day | ORAL | 1 refills | Status: DC

## 2017-09-27 MED FILL — LOSARTAN POTASSIUM 25 MG TA: 25 | 30 days supply | Qty: 30 | Fill #0

## 2017-09-27 NOTE — Telephone Encounter (Signed)
-----   Message from Zelda W Fleming, NP sent at 09/27/2017  1:09 AM EST ----- Microalbumin shows that your kidneys are affected by your diabetes. I have sent in a medication that will help protect your kidneys. I need you to make sure you are checking your blood pressure at home as well. Please make an office appointment for a nurse to check your blood pressure on the new medication in 2-3 weeks. 

## 2017-09-27 NOTE — Telephone Encounter (Signed)
-----   Message from Claiborne RiggZelda W Fleming, NP sent at 09/27/2017  1:14 AM EST ----- Please have patient stop taking norvasc 10mg  for now and only start the new medication losartan  that has been sent to the pharmacy. He should continue to take the HCTZ but not the norvasc at this time. He needs a 2 week nurse visit for BP recheck on Losartan.

## 2017-09-27 NOTE — Telephone Encounter (Signed)
-----   Message from Claiborne RiggZelda W Fleming, NP sent at 09/27/2017  1:09 AM EST ----- Microalbumin shows that your kidneys are affected by your diabetes. I have sent in a medication that will help protect your kidneys. I need you to make sure you are checking your blood pressure at home as well. Please make an office appointment for a nurse to check your blood pressure on the new medication in 2-3 weeks.

## 2017-09-27 NOTE — Telephone Encounter (Signed)
Patient returned phone call and informed on lab result.  Patient aware to pick up new medication at the pharmacy and make a blood pressure recheck appointment with the nurse.

## 2017-09-27 NOTE — Telephone Encounter (Signed)
Patient returned call and informed on PCP advising.  Patient understood and will stop taking Norvasc.

## 2017-11-08 MED FILL — glipiZIDE 10 MG TABS: 10 | 30 days supply | Qty: 60 | Fill #7

## 2017-11-08 MED FILL — METFORMIN HCL ER 500 MG TAB: 500 | 30 days supply | Qty: 60 | Fill #1

## 2017-11-08 MED FILL — LOSARTAN POTASSIUM 25 MG TA: 25 | 30 days supply | Qty: 30 | Fill #1

## 2017-11-08 MED FILL — HYDROCHLOROTHIAZIDE 25 MG T: 25 | 30 days supply | Qty: 30 | Fill #7

## 2017-11-08 MED FILL — ATORVASTATIN 10 MG TABLET: 10 | 30 days supply | Qty: 30 | Fill #7

## 2017-12-14 ENCOUNTER — Other Ambulatory Visit: Payer: Self-pay | Admitting: Nurse Practitioner

## 2017-12-14 MED FILL — glipiZIDE 10 MG TABS: 10 | 30 days supply | Qty: 60 | Fill #8

## 2017-12-14 MED FILL — HYDROCHLOROTHIAZIDE 25 MG T: 25 | 30 days supply | Qty: 30 | Fill #8

## 2017-12-14 MED FILL — METFORMIN HCL ER 500 MG TAB: 500 | 30 days supply | Qty: 60 | Fill #2

## 2017-12-14 MED FILL — ATORVASTATIN 10 MG TABLET: 10 | 30 days supply | Qty: 30 | Fill #8

## 2017-12-15 MED FILL — LOSARTAN POTASSIUM 25 MG TA: 25 | 30 days supply | Qty: 30 | Fill #0

## 2017-12-17 ENCOUNTER — Ambulatory Visit: Payer: Self-pay | Admitting: Nurse Practitioner

## 2017-12-17 MED FILL — ALBUTEROL SULFATE HFA 108 (: 108 (90 BAS | 25 days supply | Qty: 18 | Fill #0

## 2017-12-17 MED FILL — !LANTUS SOLOSTAR 100UNITS/M: 100 | 36 days supply | Qty: 9 | Fill #1

## 2018-02-09 ENCOUNTER — Encounter

## 2018-02-09 ENCOUNTER — Ambulatory Visit: Payer: Self-pay | Admitting: Nurse Practitioner

## 2018-04-13 LAB — HM HIV SCREENING LAB: HM HIV Screening: NEGATIVE

## 2018-04-18 DIAGNOSIS — Z8619 Personal history of other infectious and parasitic diseases: Secondary | ICD-10-CM | POA: Insufficient documentation

## 2018-04-28 ENCOUNTER — Ambulatory Visit: Payer: Self-pay | Attending: Family Medicine | Admitting: Physician Assistant

## 2018-04-28 VITALS — BP 166/110 | HR 82 | Temp 97.9°F | Resp 18 | Ht 64.0 in | Wt 226.0 lb

## 2018-04-28 DIAGNOSIS — I10 Essential (primary) hypertension: Secondary | ICD-10-CM

## 2018-04-28 DIAGNOSIS — E119 Type 2 diabetes mellitus without complications: Secondary | ICD-10-CM | POA: Insufficient documentation

## 2018-04-28 DIAGNOSIS — R809 Proteinuria, unspecified: Secondary | ICD-10-CM | POA: Insufficient documentation

## 2018-04-28 DIAGNOSIS — E1129 Type 2 diabetes mellitus with other diabetic kidney complication: Secondary | ICD-10-CM

## 2018-04-28 DIAGNOSIS — Z888 Allergy status to other drugs, medicaments and biological substances status: Secondary | ICD-10-CM | POA: Insufficient documentation

## 2018-04-28 DIAGNOSIS — Z79899 Other long term (current) drug therapy: Secondary | ICD-10-CM | POA: Insufficient documentation

## 2018-04-28 DIAGNOSIS — Z794 Long term (current) use of insulin: Secondary | ICD-10-CM

## 2018-04-28 DIAGNOSIS — E785 Hyperlipidemia, unspecified: Secondary | ICD-10-CM

## 2018-04-28 DIAGNOSIS — F17200 Nicotine dependence, unspecified, uncomplicated: Secondary | ICD-10-CM

## 2018-04-28 LAB — POCT GLYCOSYLATED HEMOGLOBIN (HGB A1C): HbA1c, POC (controlled diabetic range): 10.8 % — AB (ref 0.0–7.0)

## 2018-04-28 LAB — GLUCOSE, POCT (MANUAL RESULT ENTRY): POC GLUCOSE: 252 mg/dL — AB (ref 70–99)

## 2018-04-28 MED ORDER — ASPIRIN 81 MG PO CHEW: 81.0000 mg | CHEWABLE_TABLET | Freq: Every day | ORAL | 3 refills | Status: AC

## 2018-04-28 MED ORDER — METFORMIN HCL ER 500 MG PO TB24: ORAL_TABLET | ORAL | 2 refills | Status: DC

## 2018-04-28 MED ORDER — CLONIDINE HCL 0.1 MG PO TABS
0.1000 mg | ORAL_TABLET | Freq: Once | ORAL | Status: AC
  Administered 2018-04-28: 0.1 mg via ORAL

## 2018-04-28 MED ORDER — ALBUTEROL SULFATE HFA 108 (90 BASE) MCG/ACT IN AERS: 2.0000 | INHALATION_SPRAY | Freq: Four times a day (QID) | RESPIRATORY_TRACT | 2 refills | Status: DC | PRN

## 2018-04-28 MED ORDER — AMLODIPINE BESYLATE 10 MG PO TABS: 10.0000 mg | ORAL_TABLET | Freq: Every day | ORAL | 4 refills | Status: AC

## 2018-04-28 MED ORDER — LOSARTAN POTASSIUM 25 MG PO TABS: 25.0000 mg | ORAL_TABLET | Freq: Every day | ORAL | 5 refills | Status: AC

## 2018-04-28 MED ORDER — GLIPIZIDE 10 MG PO TABS: 10.0000 mg | ORAL_TABLET | Freq: Two times a day (BID) | ORAL | 3 refills | Status: AC

## 2018-04-28 MED ORDER — ATORVASTATIN CALCIUM 10 MG PO TABS: 10.0000 mg | ORAL_TABLET | Freq: Every day | ORAL | 3 refills | Status: AC

## 2018-04-28 MED ORDER — HYDROCHLOROTHIAZIDE 25 MG PO TABS: 25.0000 mg | ORAL_TABLET | Freq: Every day | ORAL | 3 refills | Status: AC

## 2018-04-28 MED FILL — !VENTOLIN HFA INHALER: 108 (90 BAS | 28 days supply | Qty: 18 | Fill #0

## 2018-04-28 MED FILL — METFORMIN HCL ER 500 MG TAB: 500 | 30 days supply | Qty: 60 | Fill #0

## 2018-04-28 MED FILL — ATORVASTATIN 10 MG TABLET: 10 | 30 days supply | Qty: 30 | Fill #0

## 2018-04-28 MED FILL — glipiZIDE 10 MG TABS: 10 | 30 days supply | Qty: 60 | Fill #0

## 2018-04-28 MED FILL — AMLODIPINE BESYLATE 10 MG T: 10 | 30 days supply | Qty: 30 | Fill #0

## 2018-04-28 MED FILL — HYDROCHLOROTHIAZIDE 25 MG T: 25 | 30 days supply | Qty: 30 | Fill #0

## 2018-04-28 MED FILL — LOSARTAN POTASSIUM 25 MG TA: 25 | 30 days supply | Qty: 30 | Fill #0

## 2018-04-28 NOTE — Progress Notes (Signed)
Patient ID: Keith Liu, male   DOB: 02-27-1976, 42 y.o.   MRN: [id number]   Keith Liu, is a 42 y.o. male  MOQ:[id number]  [id number]  DOB - Apr 24, 1976  Subjective:  Chief Complaint and HPI: Keith Liu is a 42 y.o. male here today to get medications RF.  He was incarcerated until about 2 months ago.  While incarcerated, he was taking metformin 1097m daily and glipizide 151mdaily and blood sugars were under 150.  He was not on any insulin.  Currently he has been sporadically taking a  Metformin or occasionally using Lantus bc he was out of everything else and only had a little of either of those.  (so, basically, rare/sporadic diabetes meds over last 2 months)  Out of all BP meds for 2 months.  C/o HA.    CP/dizziness/sob.    Still smoking.  Not ready to quit.    ROS:   Constitutional:  No f/c, No night sweats, No unexplained weight loss. EENT:  No vision changes, No blurry vision, No hearing changes. No mouth, throat, or ear problems.  Respiratory: No cough, No SOB Cardiac: No CP, no palpitations GI:  No abd pain, No N/V/D. GU: No Urinary s/sx Musculoskeletal: No joint pain Neuro: + headache, no dizziness, no motor weakness.  Skin: No rash Endocrine:  No polydipsia. No polyuria.  Psych: Denies SI/HI  No problems updated.  ALLERGIES: Allergies  Allergen Reactions  . Lisinopril Swelling    Angioedema of lower lip    PAST MEDICAL HISTORY: Past Medical History:  Diagnosis Date  . Diabetes mellitus without complication (HCAmbler  . Hypertension     MEDICATIONS AT HOME: Prior to Admission medications   Medication Sig Start Date End Date Taking? Authorizing Provider  albuterol (PROVENTIL HFA;VENTOLIN HFA) 108 (90 Base) MCG/ACT inhaler Inhale 2 puffs into the lungs every 6 (six) hours as needed for wheezing or shortness of breath. 04/28/18  Yes McClung, Angela M, PA-C  amLODipine (NORVASC) 10 MG tablet Take 1 tablet (10 mg total) by mouth daily. 04/28/18  Yes  McArgentina DonovanPA-C  aspirin (ASPIRIN CHILDRENS) 81 MG chewable tablet Chew 1 tablet (81 mg total) by mouth daily. 04/28/18  Yes McFreeman Caldron, PA-C  atorvastatin (LIPITOR) 10 MG tablet Take 1 tablet (10 mg total) by mouth daily. 04/28/18  Yes McArgentina DonovanPA-C  Blood Glucose Monitoring Suppl (TRUE METRIX METER) w/Device KIT USE AS INSTRUCTED 09/17/17  Yes FlGildardo PoundsNP  glipiZIDE (GLUCOTROL) 10 MG tablet Take 1 tablet (10 mg total) by mouth 2 (two) times daily before a meal. Must have office visit for refills 04/28/18  Yes McFreeman Caldron, PA-C  glucose blood (TRUE METRIX BLOOD GLUCOSE TEST) test strip Use as instructed 09/17/17  Yes FlGildardo PoundsNP  hydrochlorothiazide (HYDRODIURIL) 25 MG tablet Take 1 tablet (25 mg total) by mouth daily. 04/28/18  Yes McClung, Angela M, PA-C  Insulin Glargine (LANTUS SOLOSTAR) 100 UNIT/ML Solostar Pen Inject 25 Units into the skin daily at 10 pm. 09/17/17  Yes FlGildardo PoundsNP  Insulin Pen Needle (B-D UF III MINI PEN NEEDLES) 31G X 5 MM MISC Use as instructed 09/17/17  Yes FlGildardo PoundsNP  Insulin Pen Needle (ULTICARE MICRO PEN NEEDLES) 32G X 4 MM MISC 1 applicator by Does not apply route at bedtime. 07/21/16  Yes Langeland, Dawn T, MD  ipratropium-albuterol (DUONEB) 0.5-2.5 (3) MG/3ML SOLN Take 3 mLs by nebulization every 6 (six) hours as needed (  sob/doe). 12/29/16  Yes Langeland, Dawn T, MD  losartan (COZAAR) 25 MG tablet Take 1 tablet (25 mg total) by mouth daily. 04/28/18  Yes Freeman Caldron M, PA-C  metFORMIN (GLUCOPHAGE XR) 500 MG 24 hr tablet Take 1 tablet bid 04/28/18  Yes McClung, Angela M, PA-C  TRUEPLUS LANCETS 26G MISC 1 each by Does not apply route every 8 (eight) hours as needed. 09/17/17  Yes Gildardo Pounds, NP  lisinopril (PRINIVIL,ZESTRIL) 20 MG tablet Take 1 tablet (20 mg total) by mouth daily. 08/10/14 08/22/14  Lance Bosch, NP     Objective:  EXAM:   Vitals:   04/28/18 1002  BP: (!) 172/126  Pulse: 86    Resp: 18  Temp: 97.9 F (36.6 C)  TempSrc: Oral  SpO2: 95%  Weight: 226 lb (102.5 kg)  Height: '5\' 4"'  (1.626 m)    General appearance : A&OX3. NAD. Non-toxic-appearing HEENT: Atraumatic and Normocephalic.  PERRLA. EOM intact.  Neck: supple, no JVD. No cervical lymphadenopathy. No thyromegaly Chest/Lungs:  Breathing-non-labored, Good air entry bilaterally, breath sounds normal without rales, rhonchi, or wheezing  CVS: S1 S2 regular, no murmurs, gallops, rubs  Extremities: Bilateral Lower Ext shows no edema, both legs are warm to touch with = pulse throughout Neurology:  CN II-XII grossly intact, Non focal.   Psych:  TP linear. J/I WNL. Normal speech. Appropriate eye contact and affect.  Skin:  No Rash  Data Review Lab Results  Component Value Date   HGBA1C 10.8 (A) 04/28/2018   HGBA1C 12.5 09/17/2017   HGBA1C 10.7 12/29/2016     Assessment & Plan   1. Type 2 diabetes mellitus with microalbuminuria, with long-term current use of insulin (HCC) Uncontrolled-basically out of meds. - Glucose (CBG) - HgB A1c - Comprehensive metabolic panel - CBC with Differential/Platelet - glipiZIDE (GLUCOTROL) 10 MG tablet; Take 1 tablet (10 mg total) by mouth 2 (two) times daily before a meal. Must have office visit for refills  Dispense: 180 tablet; Refill: 3 - metFORMIN (GLUCOPHAGE XR) 500 MG 24 hr tablet; Take 1 tablet bid  Dispense: 60 tablet; Refill: 2 -get to max doses on oral meds then will decide whether to resume lantus(he says was controlled in jail on 1015m metformin once a day  and 113mglipzide twice a day) Check blood sugars fasting and at bedtime and record.  Bring to next visit.    2. Essential hypertension Uncontrolled-resume meds DASH diet.  Check BP OOO 3X/week and record and bring to next visit.   - cloNIDine (CATAPRES) tablet 0.1 mg - Comprehensive metabolic panel - CBC with Differential/Platelet - amLODipine (NORVASC) 10 MG tablet; Take 1 tablet (10 mg total) by  mouth daily.  Dispense: 90 tablet; Refill: 4 - aspirin (ASPIRIN CHILDRENS) 81 MG chewable tablet; Chew 1 tablet (81 mg total) by mouth daily.  Dispense: 90 tablet; Refill: 3 - hydrochlorothiazide (HYDRODIURIL) 25 MG tablet; Take 1 tablet (25 mg total) by mouth daily.  Dispense: 90 tablet; Refill: 3  3. Hyperlipidemia, unspecified hyperlipidemia type - Lipid Panel - atorvastatin (LIPITOR) 10 MG tablet; Take 1 tablet (10 mg total) by mouth daily.  Dispense: 90 tablet; Refill: 3  4. Tobacco dependence Cessation and materials given and encouraged.     Patient have been counseled extensively about nutrition and exercise  Return for keep 05/18/2018 appt with ZeGeryl Rankins The patient was given clear instructions to go to ER or return to medical center if symptoms don't improve, worsen or new problems develop. The  patient verbalized understanding. The patient was told to call to get lab results if they haven't heard anything in the next week.     Freeman Caldron, PA-C [facility] [place], [place]   04/28/2018, 10:19 AM

## 2018-04-28 NOTE — Patient Instructions (Signed)
Check your blood sugar fasting in the mornings and at bedtime.  Bring numbers to next visit.  Check your blood pressure 3 times/week and record and bring to next visit.      Health Risks of Smoking Smoking cigarettes is very bad for your health. Tobacco smoke has over 200 known poisons in it. It contains the poisonous gases nitrogen oxide and carbon monoxide. There are over 60 chemicals in tobacco smoke that cause cancer. Smoking is difficult to quit because a chemical in tobacco, called nicotine, causes addiction or dependence. When you smoke and inhale, nicotine is absorbed rapidly into the bloodstream through your lungs. Both inhaled and non-inhaled nicotine may be addictive. What are the risks of cigarette smoke? Cigarette smokers have an increased risk of many serious medical problems, including:  Lung cancer.  Lung disease, such as pneumonia, bronchitis, and emphysema.  Chest pain (angina) and heart attack because the heart is not getting enough oxygen.  Heart disease and peripheral blood vessel disease.  High blood pressure (hypertension).  Stroke.  Oral cancer, including cancer of the lip, mouth, or voice box.  Bladder cancer.  Pancreatic cancer.  Cervical cancer.  Pregnancy complications, including premature birth.  Stillbirths and smaller newborn babies, birth defects, and genetic damage to sperm.  Early menopause.  Lower estrogen level for women.  Infertility.  Facial wrinkles.  Blindness.  Increased risk of broken bones (fractures).  Senile dementia.  Stomach ulcers and internal bleeding.  Delayed wound healing and increased risk of complications during surgery.  Even smoking lightly shortens your life expectancy by several years.  Because of secondhand smoke exposure, children of smokers have an increased risk of the following:  Sudden infant death syndrome (SIDS).  Respiratory infections.  Lung cancer.  Heart disease.  Ear  infections.  What are the benefits of quitting? There are many health benefits of quitting smoking. Here are some of them:  Within days of quitting smoking, your risk of having a heart attack decreases, your blood flow improves, and your lung capacity improves. Blood pressure, pulse rate, and breathing patterns start returning to normal soon after quitting.  Within months, your lungs may clear up completely.  Quitting for 10 years reduces your risk of developing lung cancer and heart disease to almost that of a nonsmoker.  People who quit may see an improvement in their overall quality of life.  How do I quit smoking? Smoking is an addiction with both physical and psychological effects, and longtime habits can be hard to change. Your health care provider can recommend:  Programs and community resources, which may include group support, education, or talk therapy.  Prescription medicines to help reduce cravings.  Nicotine replacement products, such as patches, gum, and nasal sprays. Use these products only as directed. Do not replace cigarette smoking with electronic cigarettes, which are commonly called e-cigarettes. The safety of e-cigarettes is not known, and some may contain harmful chemicals.  A combination of two or more of these methods.  Where to find more information:  American Lung Association: www.lung.org  American Cancer Society: www.cancer.org Summary  Smoking cigarettes is very bad for your health. Cigarette smokers have an increased risk of many serious medical problems, including several cancers, heart disease, and stroke.  Smoking is an addiction with both physical and psychological effects, and longtime habits can be hard to change.  By stopping right away, you can greatly reduce the risk of medical problems for you and your family.  To help you quit smoking,  your health care provider can recommend programs, community resources, prescription medicines, and  nicotine replacement products such as patches, gum, and nasal sprays. This information is not intended to replace advice given to you by your health care provider. Make sure you discuss any questions you have with your health care provider. Document Released: 10/01/2004 Document Revised: 08/28/2016 Document Reviewed: 08/28/2016 Elsevier Interactive Patient Education  2017 Elsevier Inc. Diabetes Mellitus and Nutrition When you have diabetes (diabetes mellitus), it is very important to have healthy eating habits because your blood sugar (glucose) levels are greatly affected by what you eat and drink. Eating healthy foods in the appropriate amounts, at about the same times every day, can help you:  Control your blood glucose.  Lower your risk of heart disease.  Improve your blood pressure.  Reach or maintain a healthy weight.  Every person with diabetes is different, and each person has different needs for a meal plan. Your health care provider may recommend that you work with a diet and nutrition specialist (dietitian) to make a meal plan that is best for you. Your meal plan may vary depending on factors such as:  The calories you need.  The medicines you take.  Your weight.  Your blood glucose, blood pressure, and cholesterol levels.  Your activity level.  Other health conditions you have, such as heart or kidney disease.  How do carbohydrates affect me? Carbohydrates affect your blood glucose level more than any other type of food. Eating carbohydrates naturally increases the amount of glucose in your blood. Carbohydrate counting is a method for keeping track of how many carbohydrates you eat. Counting carbohydrates is important to keep your blood glucose at a healthy level, especially if you use insulin or take certain oral diabetes medicines. It is important to know how many carbohydrates you can safely have in each meal. This is different for every person. Your dietitian can help  you calculate how many carbohydrates you should have at each meal and for snack. Foods that contain carbohydrates include:  Bread, cereal, rice, pasta, and crackers.  Potatoes and corn.  Peas, beans, and lentils.  Milk and yogurt.  Fruit and juice.  Desserts, such as cakes, cookies, ice cream, and candy.  How does alcohol affect me? Alcohol can cause a sudden decrease in blood glucose (hypoglycemia), especially if you use insulin or take certain oral diabetes medicines. Hypoglycemia can be a life-threatening condition. Symptoms of hypoglycemia (sleepiness, dizziness, and confusion) are similar to symptoms of having too much alcohol. If your health care provider says that alcohol is safe for you, follow these guidelines:  Limit alcohol intake to no more than 1 drink per day for nonpregnant women and 2 drinks per day for men. One drink equals 12 oz of beer, 5 oz of wine, or 1 oz of hard liquor.  Do not drink on an empty stomach.  Keep yourself hydrated with water, diet soda, or unsweetened iced tea.  Keep in mind that regular soda, juice, and other mixers may contain a lot of sugar and must be counted as carbohydrates.  What are tips for following this plan? Reading food labels  Start by checking the serving size on the label. The amount of calories, carbohydrates, fats, and other nutrients listed on the label are based on one serving of the food. Many foods contain more than one serving per package.  Check the total grams (g) of carbohydrates in one serving. You can calculate the number of servings of carbohydrates in  one serving by dividing the total carbohydrates by 15. For example, if a food has 30 g of total carbohydrates, it would be equal to 2 servings of carbohydrates.  Check the number of grams (g) of saturated and trans fats in one serving. Choose foods that have low or no amount of these fats.  Check the number of milligrams (mg) of sodium in one serving. Most people  should limit total sodium intake to less than 2,300 mg per day.  Always check the nutrition information of foods labeled as "low-fat" or "nonfat". These foods may be higher in added sugar or refined carbohydrates and should be avoided.  Talk to your dietitian to identify your daily goals for nutrients listed on the label. Shopping  Avoid buying canned, premade, or processed foods. These foods tend to be high in fat, sodium, and added sugar.  Shop around the outside edge of the grocery store. This includes fresh fruits and vegetables, bulk grains, fresh meats, and fresh dairy. Cooking  Use low-heat cooking methods, such as baking, instead of high-heat cooking methods like deep frying.  Cook using healthy oils, such as olive, canola, or sunflower oil.  Avoid cooking with butter, cream, or high-fat meats. Meal planning  Eat meals and snacks regularly, preferably at the same times every day. Avoid going long periods of time without eating.  Eat foods high in fiber, such as fresh fruits, vegetables, beans, and whole grains. Talk to your dietitian about how many servings of carbohydrates you can eat at each meal.  Eat 4-6 ounces of lean protein each day, such as lean meat, chicken, fish, eggs, or tofu. 1 ounce is equal to 1 ounce of meat, chicken, or fish, 1 egg, or 1/4 cup of tofu.  Eat some foods each day that contain healthy fats, such as avocado, nuts, seeds, and fish. Lifestyle   Check your blood glucose regularly.  Exercise at least 30 minutes 5 or more days each week, or as told by your health care provider.  Take medicines as told by your health care provider.  Do not use any products that contain nicotine or tobacco, such as cigarettes and e-cigarettes. If you need help quitting, ask your health care provider.  Work with a counselor or diabetes educator to identify strategies to manageVeterinary surgeon stress and any emotional and social challenges. What are some questions to ask my health  care provider?  Do I need to meet with a diabetes educator?  Do I need to meet with a dietitian?  What number can I call if I have questions?  When are the best times to check my blood glucose? Where to find more information:  American Diabetes Association: diabetes.org/food-and-fitness/food  Academy of Nutrition and Dietetics: https://www.vargas.com/www.eatright.org/resources/health/diseases-and-conditions/diabetes  [employer] Institute of Diabetes and Digestive and Kidney Diseases (NIH): FindJewelers.czwww.niddk.nih.gov/health-information/diabetes/overview/diet-eating-physical-activity Summary  A healthy meal plan will help you control your blood glucose and maintain a healthy lifestyle.  Working with a diet and nutrition specialist (dietitian) can help you make a meal plan that is best for you.  Keep in mind that carbohydrates and alcohol have immediate effects on your blood glucose levels. It is important to count carbohydrates and to use alcohol carefully. This information is not intended to replace advice given to you by your health care provider. Make sure you discuss any questions you have with your health care provider. Document Released: 05/21/2005 Document Revised: 09/28/2016 Document Reviewed: 09/28/2016 Elsevier Interactive Patient Education  [employer]2018 Elsevier Inc.

## 2018-04-29 LAB — COMPREHENSIVE METABOLIC PANEL
ALT: 36 IU/L (ref 0–44)
AST: 28 IU/L (ref 0–40)
Albumin/Globulin Ratio: 1.3 (ref 1.2–2.2)
Albumin: 4 g/dL (ref 3.5–5.5)
Alkaline Phosphatase: 59 IU/L (ref 39–117)
BILIRUBIN TOTAL: 0.3 mg/dL (ref 0.0–1.2)
BUN/Creatinine Ratio: 14 (ref 9–20)
BUN: 12 mg/dL (ref 6–24)
CALCIUM: 9.5 mg/dL (ref 8.7–10.2)
CHLORIDE: 96 mmol/L (ref 96–106)
CO2: 23 mmol/L (ref 20–29)
Creatinine, Ser: 0.83 mg/dL (ref 0.76–1.27)
GFR calc Af Amer: 125 mL/min/{1.73_m2} (ref >59)
GFR, EST NON AFRICAN AMERICAN: 108 mL/min/{1.73_m2} (ref >59)
GLUCOSE: 257 mg/dL — AB (ref 65–99)
Globulin, Total: 3.2 g/dL (ref 1.5–4.5)
Potassium: 4.4 mmol/L (ref 3.5–5.2)
Sodium: 136 mmol/L (ref 134–144)
TOTAL PROTEIN: 7.2 g/dL (ref 6.0–8.5)

## 2018-04-29 LAB — CBC WITH DIFFERENTIAL/PLATELET
BASOS ABS: 0.1 10*3/uL (ref 0.0–0.2)
Basos: 1 %
EOS (ABSOLUTE): 0.3 10*3/uL (ref 0.0–0.4)
Eos: 4 %
HEMOGLOBIN: 16.7 g/dL (ref 13.0–17.7)
Hematocrit: 47.8 % (ref 37.5–51.0)
IMMATURE GRANS (ABS): 0.1 10*3/uL (ref 0.0–0.1)
IMMATURE GRANULOCYTES: 1 %
LYMPHS: 33 %
Lymphocytes Absolute: 2.8 10*3/uL (ref 0.7–3.1)
MCH: 28.7 pg (ref 26.6–33.0)
MCHC: 34.9 g/dL (ref 31.5–35.7)
MCV: 82 fL (ref 79–97)
MONOCYTES: 5 %
Monocytes Absolute: 0.4 10*3/uL (ref 0.1–0.9)
NEUTROS ABS: 4.8 10*3/uL (ref 1.4–7.0)
NEUTROS PCT: 56 %
PLATELETS: 179 10*3/uL (ref 150–450)
RBC: 5.81 x10E6/uL — ABNORMAL HIGH (ref 4.14–5.80)
RDW: 16.3 % — ABNORMAL HIGH (ref 12.3–15.4)
WBC: 8.4 10*3/uL (ref 3.4–10.8)

## 2018-04-29 LAB — LIPID PANEL
CHOLESTEROL TOTAL: 206 mg/dL — AB (ref 100–199)
Chol/HDL Ratio: 5.4 ratio — ABNORMAL HIGH (ref 0.0–5.0)
HDL: 38 mg/dL — AB (ref >39)
LDL CALC: 117 mg/dL — AB (ref 0–99)
TRIGLYCERIDES: 254 mg/dL — AB (ref 0–149)
VLDL CHOLESTEROL CAL: 51 mg/dL — AB (ref 5–40)

## 2018-05-02 ENCOUNTER — Telehealth: Payer: Self-pay | Admitting: *Deleted

## 2018-05-02 NOTE — Telephone Encounter (Signed)
MA left a message with the wife. Patient has not signed a DPR for wife. MA asked for patient to return a phone call to the office at his convienence.

## 2018-05-02 NOTE — Telephone Encounter (Signed)
-----   Message from Anders SimmondsAngela M McClung, [place] JerseyPA-C sent at 04/29/2018  5:06 PM EDT ----- Please call patient.  Blood sugar and cholesterol are high but should improve since he is restarting all of his medicines.  His blood count, kidney function, liver function, and electrolytes are all normal.  Follow up as planned.  Thanks, Georgian CoAngela McClung, PA-C

## 2018-05-03 ENCOUNTER — Telehealth: Payer: Self-pay | Admitting: Nurse Practitioner

## 2018-05-03 NOTE — Telephone Encounter (Signed)
Patient called requesting lab results, verified DOB, gave him his results, had no questions. Plans to follow up as planned.

## 2018-05-18 ENCOUNTER — Encounter: Payer: Self-pay | Admitting: Nurse Practitioner

## 2018-05-18 ENCOUNTER — Ambulatory Visit: Payer: Self-pay | Attending: Nurse Practitioner | Admitting: Nurse Practitioner

## 2018-05-18 VITALS — BP 120/84 | HR 104 | Temp 98.4°F | Ht 64.0 in | Wt 221.6 lb

## 2018-05-18 DIAGNOSIS — Z888 Allergy status to other drugs, medicaments and biological substances status: Secondary | ICD-10-CM | POA: Insufficient documentation

## 2018-05-18 DIAGNOSIS — Z794 Long term (current) use of insulin: Secondary | ICD-10-CM | POA: Insufficient documentation

## 2018-05-18 DIAGNOSIS — R809 Proteinuria, unspecified: Secondary | ICD-10-CM

## 2018-05-18 DIAGNOSIS — Z79899 Other long term (current) drug therapy: Secondary | ICD-10-CM | POA: Insufficient documentation

## 2018-05-18 DIAGNOSIS — E1129 Type 2 diabetes mellitus with other diabetic kidney complication: Secondary | ICD-10-CM

## 2018-05-18 DIAGNOSIS — I10 Essential (primary) hypertension: Secondary | ICD-10-CM | POA: Insufficient documentation

## 2018-05-18 DIAGNOSIS — J0110 Acute frontal sinusitis, unspecified: Secondary | ICD-10-CM | POA: Insufficient documentation

## 2018-05-18 DIAGNOSIS — I2510 Atherosclerotic heart disease of native coronary artery without angina pectoris: Secondary | ICD-10-CM | POA: Insufficient documentation

## 2018-05-18 DIAGNOSIS — Z833 Family history of diabetes mellitus: Secondary | ICD-10-CM | POA: Insufficient documentation

## 2018-05-18 DIAGNOSIS — Z7982 Long term (current) use of aspirin: Secondary | ICD-10-CM | POA: Insufficient documentation

## 2018-05-18 DIAGNOSIS — E669 Obesity, unspecified: Secondary | ICD-10-CM | POA: Insufficient documentation

## 2018-05-18 DIAGNOSIS — Z825 Family history of asthma and other chronic lower respiratory diseases: Secondary | ICD-10-CM | POA: Insufficient documentation

## 2018-05-18 DIAGNOSIS — E119 Type 2 diabetes mellitus without complications: Secondary | ICD-10-CM | POA: Insufficient documentation

## 2018-05-18 DIAGNOSIS — Z8249 Family history of ischemic heart disease and other diseases of the circulatory system: Secondary | ICD-10-CM | POA: Insufficient documentation

## 2018-05-18 DIAGNOSIS — Z6838 Body mass index (BMI) 38.0-38.9, adult: Secondary | ICD-10-CM | POA: Insufficient documentation

## 2018-05-18 LAB — GLUCOSE, POCT (MANUAL RESULT ENTRY)
POC Glucose: 337 mg/dl — AB (ref 70–99)
POC Glucose: 368 mg/dL — AB (ref 70–99)

## 2018-05-18 LAB — POCT URINALYSIS DIP (CLINITEK)
Bilirubin, UA: NEGATIVE
Blood, UA: NEGATIVE
Glucose, UA: 500 mg/dL — AB
Ketones, POC UA: NEGATIVE mg/dL
Leukocytes, UA: NEGATIVE
Nitrite, UA: NEGATIVE
Spec Grav, UA: 1.015
Urobilinogen, UA: 0.2 U/dL
pH, UA: 5.5

## 2018-05-18 MED ORDER — INSULIN GLARGINE 100 UNIT/ML SOLOSTAR PEN: 25.0000 [IU] | PEN_INJECTOR | Freq: Every day | SUBCUTANEOUS | 99 refills | Status: AC

## 2018-05-18 MED ORDER — METFORMIN HCL 1000 MG PO TABS: 1000.0000 mg | ORAL_TABLET | Freq: Two times a day (BID) | ORAL | 3 refills | Status: AC

## 2018-05-18 MED ORDER — INSULIN ASPART 100 UNIT/ML ~~LOC~~ SOLN
20.0000 [IU] | Freq: Once | SUBCUTANEOUS | Status: AC
  Administered 2018-05-18: 20 [IU] via SUBCUTANEOUS

## 2018-05-18 MED ORDER — AMOXICILLIN-POT CLAVULANATE 875-125 MG PO TABS: 1.0000 | ORAL_TABLET | Freq: Two times a day (BID) | ORAL | 0 refills | Status: AC

## 2018-05-18 MED ORDER — INSULIN PEN NEEDLE 31G X 5 MM MISC: 1 refills | Status: AC

## 2018-05-18 NOTE — Progress Notes (Signed)
Assessment & Plan:  [name] was seen today for stye.  Diagnoses and all orders for this visit:  Type 2 diabetes mellitus with microalbuminuria, with long-term current use of insulin (HCC) -     Glucose (CBG) -     POCT URINALYSIS DIP (CLINITEK) -     insulin aspart (novoLOG) injection 20 Units -     Insulin Glargine (LANTUS SOLOSTAR) 100 UNIT/ML Solostar Pen; Inject 25 Units into the skin daily at 10 pm. -     Insulin Pen Needle (B-D UF III MINI PEN NEEDLES) 31G X 5 MM MISC; Use as instructed -     metFORMIN (GLUCOPHAGE) 1000 MG tablet; Take 1 tablet (1,000 mg total) by mouth 2 (two) times daily with a meal. -     Glucose (CBG)  Acute non-recurrent frontal sinusitis -     amoxicillin-clavulanate (AUGMENTIN) 875-125 MG tablet; Take 1 tablet by mouth 2 (two) times daily for 7 days.    Patient has been counseled on age-appropriate routine health concerns for screening and prevention. These are reviewed and up-to-date. Referrals have been placed accordingly. Immunizations are up-to-date or declined.    Subjective:   Chief Complaint  Patient presents with  . Stye    Pt. stated he think he have a stye on his left eye. Pt. stated he is having sinus problem.    HPI Keith Liu 42 y.o. male presents to office today for follow up DM and with complaints of sinusitis.  Sinusitis: Patient presents with sinusitis.  His symptoms include nasal congestion, sneezing, fevers, sore throats, headaches.  There has not been a history of purulent rhinorrhea, spitting/vomiting mucous. There has not been a history of chronic otitis media or pharyngotonsillitis.  He has tried OTC sinus/cold medicine at home with no relief.   He has not had allergy testing.  Diabetes Mellitus Type 2 Chronic. Poorly controlled. Checking blood sugars sporadically at home. Average readings: 215-220s. He required 20 units of insulin today in the office. He continues to smoke. Risk factors for CAD: obesity, DM, Smoking,  HTN. Endorses medication compliance taking Lantus 25 units nightly, metformin 1000 mg BID, and glipizide 10 mg BID. He denies any hypo or hyperglycemic symptoms. He is taking a statin and denies any statin intolerance.  Lab Results  Component Value Date   HGBA1C 10.8 (A) 04/28/2018   CHRONIC HYPERTENSION Disease Monitoring  Blood pressure range BP Readings from Last 3 Encounters:  05/18/18 120/84  04/28/18 (!) 166/110  09/17/17 118/76    Chest pain: no   Dyspnea: no   Claudication: no  Medication compliance: yes, amlodipine 10 mg, HCTZ 25 mg,  Losartan 25 mg daily Medication Side Effects  Lightheadedness: no   Urinary frequency: no   Edema: no   Impotence: no  Preventitive Healthcare:  Exercise: no   Diet Pattern: diet: general  Salt Restriction:  no   Review of Systems  Constitutional: Positive for fever. Negative for malaise/fatigue and weight loss.  HENT: Positive for congestion, sinus pain and sore throat. Negative for nosebleeds.   Eyes: Negative.  Negative for blurred vision, double vision and photophobia.  Respiratory: Positive for cough. Negative for shortness of breath.   Cardiovascular: Negative.  Negative for chest pain, palpitations and leg swelling.  Gastrointestinal: Negative.  Negative for heartburn, nausea and vomiting.  Musculoskeletal: Negative.  Negative for myalgias.  Neurological: Positive for headaches. Negative for dizziness, focal weakness and seizures.  Psychiatric/Behavioral: Negative.  Negative for suicidal ideas.    Past  Medical History:  Diagnosis Date  . Diabetes mellitus without complication ([place])   . Hypertension     Past Surgical History:  Procedure Laterality Date  . KNEE SURGERY Left     Family History  Problem Relation Age of Onset  . COPD Mother   . Diabetes Mother   . Hypertension Mother     Social History Reviewed with no changes to be made today.   Outpatient Medications Prior to Visit  Medication Sig Dispense Refill    . albuterol (PROVENTIL HFA;VENTOLIN HFA) 108 (90 Base) MCG/ACT inhaler Inhale 2 puffs into the lungs every 6 (six) hours as needed for wheezing or shortness of breath. 1 Inhaler 2  . amLODipine (NORVASC) 10 MG tablet Take 1 tablet (10 mg total) by mouth daily. 90 tablet 4  . aspirin (ASPIRIN CHILDRENS) 81 MG chewable tablet Chew 1 tablet (81 mg total) by mouth daily. 90 tablet 3  . atorvastatin (LIPITOR) 10 MG tablet Take 1 tablet (10 mg total) by mouth daily. 90 tablet 3  . Blood Glucose Monitoring Suppl (TRUE METRIX METER) w/Device KIT USE AS INSTRUCTED 1 kit 0  . glipiZIDE (GLUCOTROL) 10 MG tablet Take 1 tablet (10 mg total) by mouth 2 (two) times daily before a meal. Must have office visit for refills 180 tablet 3  . glucose blood (TRUE METRIX BLOOD GLUCOSE TEST) test strip Use as instructed 100 each 12  . hydrochlorothiazide (HYDRODIURIL) 25 MG tablet Take 1 tablet (25 mg total) by mouth daily. 90 tablet 3  . ipratropium-albuterol (DUONEB) 0.5-2.5 (3) MG/3ML SOLN Take 3 mLs by nebulization every 6 (six) hours as needed (sob/doe). 360 mL 1  . losartan (COZAAR) 25 MG tablet Take 1 tablet (25 mg total) by mouth daily. 30 tablet 5  . TRUEPLUS LANCETS 26G MISC 1 each by Does not apply route every 8 (eight) hours as needed. 100 each 12  . metFORMIN (GLUCOPHAGE XR) 500 MG 24 hr tablet Take 1 tablet bid 60 tablet 2  . Insulin Glargine (LANTUS SOLOSTAR) 100 UNIT/ML Solostar Pen Inject 25 Units into the skin daily at 10 pm. (Patient not taking: Reported on 05/18/2018) 12 pen PRN  . Insulin Pen Needle (B-D UF III MINI PEN NEEDLES) 31G X 5 MM MISC Use as instructed (Patient not taking: Reported on 05/18/2018) 90 each 1  . Insulin Pen Needle (ULTICARE MICRO PEN NEEDLES) 32G X 4 MM MISC 1 applicator by Does not apply route at bedtime. (Patient not taking: Reported on 05/18/2018) 100 each 2   No facility-administered medications prior to visit.     Allergies  Allergen Reactions  . Lisinopril Swelling     Angioedema of lower lip       Objective:    BP 120/84 (BP Location: Left Arm, Patient Position: Sitting, Cuff Size: Large)   Pulse (!) 104   Temp 98.4 F (36.9 C) (Oral)   Ht 5' 4" (1.626 m)   Wt 221 lb 9.6 oz (100.5 kg)   SpO2 94%   BMI 38.04 kg/m  Wt Readings from Last 3 Encounters:  05/18/18 221 lb 9.6 oz (100.5 kg)  04/28/18 226 lb (102.5 kg)  09/17/17 217 lb 6.4 oz (98.6 kg)    Physical Exam  Constitutional: He is oriented to person, place, and time. He appears well-developed and well-nourished. He is cooperative.  HENT:  Head: Normocephalic and atraumatic.  Right Ear: Hearing, external ear and ear canal normal. A middle ear effusion is present.  Left Ear: External ear and  ear canal normal. A middle ear effusion is present.  Nose: Mucosal edema and rhinorrhea present. Right sinus exhibits frontal sinus tenderness. Right sinus exhibits no maxillary sinus tenderness. Left sinus exhibits frontal sinus tenderness. Left sinus exhibits no maxillary sinus tenderness.  Mouth/Throat: Uvula is midline. Posterior oropharyngeal edema and posterior oropharyngeal erythema present.  Nasal mucosal edema with purulent drainage in nasal vault  Eyes: EOM are normal.  Neck: Normal range of motion.  Cardiovascular: Normal rate, regular rhythm and normal heart sounds. Exam reveals no gallop and no friction rub.  No murmur heard. Pulmonary/Chest: Effort normal and breath sounds normal. No tachypnea. No respiratory distress. He has no decreased breath sounds. He has no wheezes. He has no rhonchi. He has no rales. He exhibits no tenderness.  Abdominal: Soft. Bowel sounds are normal.  Musculoskeletal: Normal range of motion. He exhibits no edema.  Neurological: He is alert and oriented to person, place, and time. Coordination normal.  Skin: Skin is warm and dry.  Psychiatric: He has a normal mood and affect. His behavior is normal. Judgment and thought content normal.  Nursing note and vitals  reviewed.        Patient has been counseled extensively about nutrition and exercise as well as the importance of adherence with medications and regular follow-up. The patient was given clear instructions to go to ER or return to medical center if symptoms don't improve, worsen or new problems develop. The patient verbalized understanding.   Follow-up: Return in about 6 weeks (around 06/29/2018) for DM; GLUCOMETER; BMP.   Zelda W Fleming, FNP-BC [facility] [place], [place] [phone]   05/20/2018, 11:47 PM 

## 2018-05-19 ENCOUNTER — Encounter: Payer: Self-pay | Admitting: Nurse Practitioner

## 2018-05-26 MED FILL — AMLODIPINE BESYLATE 10 MG T: 10 | 30 days supply | Qty: 30 | Fill #1

## 2018-05-26 MED FILL — glipiZIDE 10 MG TABS: 10 | 30 days supply | Qty: 60 | Fill #1

## 2018-05-26 MED FILL — ATORVASTATIN 10 MG TABLET: 10 | 30 days supply | Qty: 30 | Fill #1

## 2018-05-26 MED FILL — HYDROCHLOROTHIAZIDE 25 MG T: 25 | 30 days supply | Qty: 30 | Fill #1

## 2018-06-02 MED FILL — metFORMIN HCL 1000 MG TABS: 1000 | 30 days supply | Qty: 60 | Fill #0

## 2018-06-02 MED FILL — AMOX-CLAV 875-125 MG TABLET: 875-125 | 7 days supply | Qty: 14 | Fill #0

## 2018-06-02 MED FILL — LOSARTAN POTASSIUM 25 MG TA: 25 | 30 days supply | Qty: 30 | Fill #1

## 2018-06-02 MED FILL — $LANTUS SOLOSTAR 100 UNITS/: 100 | 36 days supply | Qty: 9 | Fill #2

## 2018-06-10 MED FILL — glipiZIDE 10 MG TABS: 10 | 30 days supply | Qty: 60 | Fill #1

## 2018-06-10 MED FILL — HYDROCHLOROTHIAZIDE 25 MG T: 25 | 30 days supply | Qty: 30 | Fill #1

## 2018-06-10 MED FILL — AMLODIPINE BESYLATE 10 MG T: 10 | 30 days supply | Qty: 30 | Fill #1

## 2018-06-29 ENCOUNTER — Ambulatory Visit: Payer: Self-pay | Admitting: Nurse Practitioner

## 2018-07-25 MED FILL — TRUE METRIX TEST STRIP: 30 days supply | Qty: 100 | Fill #1

## 2018-07-25 MED FILL — $LANTUS SOLOSTAR 100 UNITS/: 100 | 108 days supply | Qty: 27 | Fill #3

## 2018-07-25 MED FILL — glipiZIDE 10 MG TABS: 10 | 30 days supply | Qty: 60 | Fill #2

## 2018-07-25 MED FILL — ATORVASTATIN 10 MG TABLET: 10 | 30 days supply | Qty: 30 | Fill #1

## 2018-09-15 MED FILL — AMLODIPINE BESYLATE 10 MG T: 10 | 30 days supply | Qty: 30 | Fill #2

## 2018-09-15 MED FILL — LOSARTAN POTASSIUM 25 MG TA: 25 | 30 days supply | Qty: 30 | Fill #2

## 2018-09-15 MED FILL — metFORMIN HCL 1000 MG TABS: 1000 | 30 days supply | Qty: 60 | Fill #1

## 2018-09-15 MED FILL — glipiZIDE 10 MG TABS: 10 | 30 days supply | Qty: 60 | Fill #3

## 2018-09-15 MED FILL — HYDROCHLOROTHIAZIDE 25 MG T: 25 | 30 days supply | Qty: 30 | Fill #2

## 2018-09-15 MED FILL — ATORVASTATIN 10 MG TABLET: 10 | 30 days supply | Qty: 30 | Fill #2

## 2018-09-21 MED FILL — !VENTOLIN HFA INHALER: 108 (90 BAS | 28 days supply | Qty: 18 | Fill #1

## 2018-11-09 MED FILL — AMLODIPINE BESYLATE 10 MG T: 10 | 30 days supply | Qty: 30 | Fill #3

## 2018-11-09 MED FILL — ATORVASTATIN 10 MG TABLET: 10 | 30 days supply | Qty: 30 | Fill #3

## 2018-11-09 MED FILL — LOSARTAN POTASSIUM 25 MG TA: 25 | 30 days supply | Qty: 30 | Fill #3

## 2018-11-09 MED FILL — HYDROCHLOROTHIAZIDE 25 MG T: 25 | 30 days supply | Qty: 30 | Fill #3

## 2018-11-09 MED FILL — metFORMIN HCL 1000 MG TABS: 1000 | 30 days supply | Qty: 60 | Fill #2

## 2018-11-09 MED FILL — glipiZIDE 10 MG TABS: 10 | 30 days supply | Qty: 60 | Fill #4

## 2019-01-02 ENCOUNTER — Other Ambulatory Visit: Payer: Self-pay | Admitting: Nurse Practitioner

## 2019-01-02 DIAGNOSIS — Z794 Long term (current) use of insulin: Principal | ICD-10-CM

## 2019-01-02 DIAGNOSIS — E1129 Type 2 diabetes mellitus with other diabetic kidney complication: Secondary | ICD-10-CM

## 2019-01-02 DIAGNOSIS — R809 Proteinuria, unspecified: Principal | ICD-10-CM

## 2019-01-02 MED FILL — !VENTOLIN HFA INHALER: 108 (90 BAS | 28 days supply | Qty: 18 | Fill #2

## 2019-01-19 MED FILL — TRUE METRIX TEST STRIP: 25 days supply | Qty: 100 | Fill #0

## 2019-01-19 MED FILL — LOSARTAN POTASSIUM 25 MG TA: 25 | 30 days supply | Qty: 30 | Fill #4

## 2019-01-19 MED FILL — ?ATORVASTATIN 10 MG TABLET: 10 | 30 days supply | Qty: 30 | Fill #4

## 2019-01-19 MED FILL — HYDROCHLOROTHIAZIDE 25 MG T: 25 | 30 days supply | Qty: 30 | Fill #4

## 2019-01-19 MED FILL — ?AMLODIPINE BESYLATE 10 MG: 10 | 30 days supply | Qty: 30 | Fill #4

## 2019-01-19 MED FILL — ?GLIPIZIDE 10 MG TABLET: 10 | 30 days supply | Qty: 60 | Fill #5

## 2019-01-19 MED FILL — $LANTUS SOLOSTAR 100 UNITS/: 100 | 84 days supply | Qty: 21 | Fill #0

## 2019-03-06 MED FILL — LOSARTAN POTASSIUM 25 MG TA: 25 | 30 days supply | Qty: 30 | Fill #5

## 2019-03-06 MED FILL — ?GLIPIZIDE 10 MG TABLET: 10 | 30 days supply | Qty: 60 | Fill #6

## 2019-03-06 MED FILL — ?HYDROCHLOROTHIAZIDE 25MG T: 25 | 30 days supply | Qty: 30 | Fill #5

## 2019-03-06 MED FILL — ?AMLODIPINE BESYLATE 10 MG: 10 | 30 days supply | Qty: 30 | Fill #5

## 2019-03-06 MED FILL — ?ATORVASTATIN 10 MG TABLET: 10 | 30 days supply | Qty: 30 | Fill #5

## 2019-03-08 ENCOUNTER — Other Ambulatory Visit: Payer: Self-pay | Admitting: Physician Assistant

## 2019-03-08 MED FILL — !VENTOLIN HFA INHALER: 108 (90 BAS | 25 days supply | Qty: 18 | Fill #0

## 2019-03-28 MED FILL — ?HYDROCHLOROTHIAZIDE 25MG T: 25 | 30 days supply | Qty: 30 | Fill #5

## 2019-03-28 MED FILL — !VENTOLIN HFA INHALER: 108 (90 BAS | 25 days supply | Qty: 18 | Fill #0

## 2019-03-28 MED FILL — ?GLIPIZIDE 10 MG TABLET: 10 | 30 days supply | Qty: 60 | Fill #6

## 2019-03-28 MED FILL — metFORMIN HCL 1000 MG TABS: 1000 | 30 days supply | Qty: 60 | Fill #3

## 2019-03-28 MED FILL — ?ATORVASTATIN 10 MG TABLET: 10 | 30 days supply | Qty: 30 | Fill #5

## 2019-03-28 MED FILL — LOSARTAN POTASSIUM 25 MG TA: 25 | 30 days supply | Qty: 30 | Fill #5

## 2019-03-28 MED FILL — ?AMLODIPINE BESYLATE 10 MG: 10 | 30 days supply | Qty: 30 | Fill #5

## 2019-04-06 MED FILL — ?HYDROCHLOROTHIAZIDE 25MG T: 25 | 30 days supply | Qty: 30 | Fill #5

## 2019-04-06 MED FILL — $LANTUS SOLOSTAR 100 UNITS/: 100 | 36 days supply | Qty: 9 | Fill #1

## 2019-04-06 MED FILL — ?GLIPIZIDE 10 MG TABLET: 10 | 30 days supply | Qty: 60 | Fill #6

## 2019-04-06 MED FILL — !VENTOLIN HFA INHALER: 108 (90 BAS | 25 days supply | Qty: 18 | Fill #0

## 2019-04-06 MED FILL — ?AMLODIPINE BESYLATE 10 MG: 10 | 30 days supply | Qty: 30 | Fill #5

## 2019-04-06 MED FILL — ?ATORVASTATIN 10 MG TABLET: 10 | 30 days supply | Qty: 30 | Fill #5

## 2019-04-06 MED FILL — LOSARTAN POTASSIUM 25 MG TA: 25 | 30 days supply | Qty: 30 | Fill #5

## 2019-05-27 ENCOUNTER — Emergency Department
Admission: EM | Admit: 2019-05-27 | Discharge: 2019-05-27 | Disposition: A | Discharge status: Home / Self Care | Payer: Self-pay | Attending: Emergency Medicine | Admitting: Emergency Medicine

## 2019-05-27 ENCOUNTER — Encounter: Payer: Self-pay | Admitting: Emergency Medicine

## 2019-05-27 ENCOUNTER — Emergency Department: Payer: Self-pay

## 2019-05-27 ENCOUNTER — Other Ambulatory Visit: Payer: Self-pay

## 2019-05-27 DIAGNOSIS — I10 Essential (primary) hypertension: Secondary | ICD-10-CM | POA: Insufficient documentation

## 2019-05-27 DIAGNOSIS — R531 Weakness: Secondary | ICD-10-CM | POA: Insufficient documentation

## 2019-05-27 DIAGNOSIS — F1010 Alcohol abuse, uncomplicated: Secondary | ICD-10-CM | POA: Insufficient documentation

## 2019-05-27 DIAGNOSIS — Z79899 Other long term (current) drug therapy: Secondary | ICD-10-CM | POA: Insufficient documentation

## 2019-05-27 DIAGNOSIS — Z20828 Contact with and (suspected) exposure to other viral communicable diseases: Secondary | ICD-10-CM | POA: Insufficient documentation

## 2019-05-27 DIAGNOSIS — M4802 Spinal stenosis, cervical region: Secondary | ICD-10-CM

## 2019-05-27 DIAGNOSIS — E119 Type 2 diabetes mellitus without complications: Secondary | ICD-10-CM | POA: Insufficient documentation

## 2019-05-27 DIAGNOSIS — Z794 Long term (current) use of insulin: Secondary | ICD-10-CM | POA: Insufficient documentation

## 2019-05-27 DIAGNOSIS — Y906 Blood alcohol level of 120-199 mg/100 ml: Secondary | ICD-10-CM | POA: Insufficient documentation

## 2019-05-27 DIAGNOSIS — F17210 Nicotine dependence, cigarettes, uncomplicated: Secondary | ICD-10-CM | POA: Insufficient documentation

## 2019-05-27 LAB — ETHANOL: Alcohol, Ethyl (B): 130 mg/dL — ABNORMAL HIGH (ref <10)

## 2019-05-27 LAB — CBC
HCT: 50.8 % (ref 39.0–52.0)
Hemoglobin: 16.4 g/dL (ref 13.0–17.0)
MCH: 25.8 pg — ABNORMAL LOW (ref 26.0–34.0)
MCHC: 32.3 g/dL (ref 30.0–36.0)
MCV: 80 fL (ref 80.0–100.0)
Platelets: 226 K/uL (ref 150–400)
RBC: 6.35 MIL/uL — ABNORMAL HIGH (ref 4.22–5.81)
RDW: 13.9 % (ref 11.5–15.5)
WBC: 9.4 K/uL (ref 4.0–10.5)
nRBC: 0 % (ref 0.0–0.2)

## 2019-05-27 LAB — URINE DRUG SCREEN, QUALITATIVE (ARMC ONLY)
Amphetamines, Ur Screen: NOT DETECTED
Barbiturates, Ur Screen: NOT DETECTED
Benzodiazepine, Ur Scrn: NOT DETECTED
Cannabinoid 50 Ng, Ur ~~LOC~~: NOT DETECTED
Cocaine Metabolite,Ur ~~LOC~~: NOT DETECTED
MDMA (Ecstasy)Ur Screen: NOT DETECTED
Methadone Scn, Ur: NOT DETECTED
Opiate, Ur Screen: NOT DETECTED
Phencyclidine (PCP) Ur S: NOT DETECTED
Tricyclic, Ur Screen: NOT DETECTED

## 2019-05-27 LAB — COMPREHENSIVE METABOLIC PANEL
ALT: 34 U/L (ref 0–44)
AST: 33 U/L (ref 15–41)
Albumin: 3.9 g/dL (ref 3.5–5.0)
Alkaline Phosphatase: 55 U/L (ref 38–126)
Anion gap: 11 (ref 5–15)
BUN: 11 mg/dL (ref 6–20)
CO2: 26 mmol/L (ref 22–32)
Calcium: 9 mg/dL (ref 8.9–10.3)
Chloride: 99 mmol/L (ref 98–111)
Creatinine, Ser: 0.72 mg/dL (ref 0.61–1.24)
GFR calc Af Amer: >60 mL/min (ref >60)
GFR calc non Af Amer: >60 mL/min (ref >60)
Glucose, Bld: 275 mg/dL — ABNORMAL HIGH (ref 70–99)
Potassium: 3.1 mmol/L — ABNORMAL LOW (ref 3.5–5.1)
Sodium: 136 mmol/L (ref 135–145)
Total Bilirubin: 0.5 mg/dL (ref 0.3–1.2)
Total Protein: 7.7 g/dL (ref 6.5–8.1)

## 2019-05-27 LAB — GLUCOSE, CAPILLARY: Glucose-Capillary: 289 mg/dL — ABNORMAL HIGH (ref 70–99)

## 2019-05-27 MED ORDER — LORAZEPAM 2 MG/ML IJ SOLN
2.0000 mg | Freq: Once | INTRAMUSCULAR | Status: AC
  Administered 2019-05-27: 2 mg via INTRAVENOUS
  Filled 2019-05-27: qty 1

## 2019-05-27 MED ORDER — SODIUM CHLORIDE 0.9 % IV SOLN
1000.0000 mL | Freq: Once | INTRAVENOUS | Status: AC
  Administered 2019-05-27: 1000 mL via INTRAVENOUS

## 2019-05-27 NOTE — ED Notes (Signed)
SAT now 100% 3l, decreased to 2l 02.

## 2019-05-27 NOTE — ED Provider Notes (Addendum)
[facility] Emergency Department Provider Note   ____________________________________________    I have reviewed the triage vital signs and the nursing notes.   HISTORY  Chief Complaint Numbness     HPI Keith Liu is a 43 y.o. male with a history of diabetes and hypertension who presents with complaints of numbness, diffusely.  Patient admits to heavy alcohol consumption tonight, he reports that he was walking and abruptly his "whole body gave out" when he fell forward.  He had difficulty getting himself up.  This is never happened before.  Denies headache.  Is able to move his arms and legs and they feel normal at this time.  Denies drug use.  No abdominal pain.  No back pain.  No neck pain.  No headache  Past Medical History:  Diagnosis Date  . Diabetes mellitus without complication ([place])   . Hypertension     Patient Active Problem List   Diagnosis Date Noted  . Obesity hypoventilation syndrome ([place]) 03/13/2015  . Cardiomegaly 03/13/2015  . Essential hypertension 11/06/2014  . SOB (shortness of breath) 11/06/2014  . DM type 2 (diabetes mellitus, type 2) ([place]) 09/21/2014  . Hyperlipidemia associated with type 2 diabetes mellitus ([place]) 09/21/2014  . Tobacco dependence 09/21/2014    Past Surgical History:  Procedure Laterality Date  . KNEE SURGERY Left     Prior to Admission medications   Medication Sig Start Date End Date Taking? Authorizing Provider  albuterol (VENTOLIN HFA) 108 (90 Base) MCG/ACT inhaler Inhale 2 puffs into the lungs every 6 (six) hours as needed for wheezing or shortness of breath. MUST MAKE APPT FOR FURTHER REFILLS 03/08/19   Gildardo Pounds, NP  amLODipine (NORVASC) 10 MG tablet Take 1 tablet (10 mg total) by mouth daily. 04/28/18   Argentina Donovan, PA-C  aspirin (ASPIRIN CHILDRENS) 81 MG chewable tablet Chew 1 tablet (81 mg total) by mouth daily. 04/28/18   Argentina Donovan, PA-C  atorvastatin (LIPITOR) 10 MG  tablet Take 1 tablet (10 mg total) by mouth daily. 04/28/18   Argentina Donovan, PA-C  Blood Glucose Monitoring Suppl (TRUE METRIX METER) w/Device KIT USE AS INSTRUCTED 09/17/17   Gildardo Pounds, NP  glipiZIDE (GLUCOTROL) 10 MG tablet Take 1 tablet (10 mg total) by mouth 2 (two) times daily before a meal. Must have office visit for refills 04/28/18   Freeman Caldron M, PA-C  glucose blood test strip Use as instructed 01/02/19   Charlott Rakes, MD  hydrochlorothiazide (HYDRODIURIL) 25 MG tablet Take 1 tablet (25 mg total) by mouth daily. 04/28/18   Argentina Donovan, PA-C  Insulin Glargine (LANTUS SOLOSTAR) 100 UNIT/ML Solostar Pen Inject 25 Units into the skin daily at 10 pm. 05/18/18   Gildardo Pounds, NP  Insulin Pen Needle (B-D UF III MINI PEN NEEDLES) 31G X 5 MM MISC Use as instructed 05/18/18   Gildardo Pounds, NP  ipratropium-albuterol (DUONEB) 0.5-2.5 (3) MG/3ML SOLN Take 3 mLs by nebulization every 6 (six) hours as needed (sob/doe). 12/29/16   Maren Reamer, MD  losartan (COZAAR) 25 MG tablet Take 1 tablet (25 mg total) by mouth daily. 04/28/18   Argentina Donovan, PA-C  metFORMIN (GLUCOPHAGE) 1000 MG tablet Take 1 tablet (1,000 mg total) by mouth 2 (two) times daily with a meal. 05/18/18 06/17/18  Gildardo Pounds, NP  TRUEPLUS LANCETS 26G MISC 1 each by Does not apply route every 8 (eight) hours as needed. 09/17/17   Raul Del,  Vernia Buff, NP  lisinopril (PRINIVIL,ZESTRIL) 20 MG tablet Take 1 tablet (20 mg total) by mouth daily. 08/10/14 08/22/14  Lance Bosch, NP     Allergies Lisinopril  Family History  Problem Relation Age of Onset  . COPD Mother   . Diabetes Mother   . Hypertension Mother     Social History Social History   Tobacco Use  . Smoking status: Heavy Tobacco Smoker    Packs/day: 1.50    Types: Cigarettes  . Smokeless tobacco: Never Used  Substance Use Topics  . Alcohol use: Yes    Alcohol/week: 0.0 standard drinks    Frequency: Never  . Drug use: No    Review  of Systems  Constitutional: No fever/chills Eyes: No visual changes.  ENT: No sore throat. Cardiovascular: Denies chest pain. Respiratory: Denies shortness of breath. Gastrointestinal: No abdominal pain.  No nausea, no vomiting.   Genitourinary: Negative for dysuria. Musculoskeletal: Negative for back pain. Skin: Negative for rash. Neurological: As above   ____________________________________________   PHYSICAL EXAM:  VITAL SIGNS: ED Triage Vitals  Enc Vitals Group     BP 05/27/19 0401 (!) 135/94     Pulse Rate 05/27/19 0401 (!) 108     Resp 05/27/19 0401 18     Temp 05/27/19 0401 98 F (36.7 C)     Temp Source 05/27/19 0401 Oral     SpO2 05/27/19 0401 95 %     Weight 05/27/19 0402 99.8 kg (220 lb)     Height 05/27/19 0402 1.753 m ('5\' 9"' )     Head Circumference --      Peak Flow --      Pain Score 05/27/19 0402 8     Pain Loc --      Pain Edu? --      Excl. in [place]? --     Constitutional: Alert and oriented. No acute distress.  Eyes: Conjunctivae are normal.  Head: Atraumatic.  Neck:  Painless ROM Cardiovascular: Normal rate, regular rhythm. Grossly normal heart sounds.  Good peripheral circulation. Respiratory: Normal respiratory effort.  No retractions. Lungs CTAB. Gastrointestinal: Soft and nontender. No distention.  No CVA tenderness.  Musculoskeletal: No lower extremity tenderness nor edema.  Warm and well perfused Neurologic:  Normal speech and language. No gross focal neurologic deficits are appreciated.  Moves all extremities equally, normal strength in all extremities.  Cranial nerves II to XII are normal Skin:  Skin is warm, dry and intact. No rash noted. Psychiatric: Mood and affect are normal. Speech and behavior are normal.  ____________________________________________   LABS (all labs ordered are listed, but only abnormal results are displayed)  Labs Reviewed  GLUCOSE, CAPILLARY - Abnormal; Notable for the following components:      Result Value    Glucose-Capillary 289 (*)    All other components within normal limits  CBC - Abnormal; Notable for the following components:   RBC 6.35 (*)    MCH 25.8 (*)    All other components within normal limits  COMPREHENSIVE METABOLIC PANEL - Abnormal; Notable for the following components:   Potassium 3.1 (*)    Glucose, Bld 275 (*)    All other components within normal limits  ETHANOL - Abnormal; Notable for the following components:   Alcohol, Ethyl (B) 130 (*)    All other components within normal limits  URINE DRUG SCREEN, QUALITATIVE ([facility] ONLY)   ____________________________________________  EKG  ED ECG REPORT I, Lavonia Drafts, the attending physician, personally viewed and interpreted  this ECG.  Date: 05/27/2019  Rhythm: normal sinus rhythm QRS Axis: Abnormal Intervals: normal ST/T Wave abnormalities: Nonspecific changes Narrative Interpretation: no evidence of acute ischemia  ____________________________________________  RADIOLOGY  None ____________________________________________   PROCEDURES  Procedure(s) performed: No  Procedures   Critical Care performed: No ____________________________________________   INITIAL IMPRESSION / ASSESSMENT AND PLAN / ED COURSE  Pertinent labs & imaging results that were available during my care of the patient were reviewed by me and considered in my medical decision making (see chart for details).  Patient presents with weakness as noted above, exam is overall reassuring in the emergency department.  Suspicious for substance related weakness/numbness, not consistent with CVA given bilateral nature.  Pending labs, a UDS, ethanol level.  Lab work is overall reassuring, very mild hypokalemia, alcohol 130  ----------------------------------------- 6:41 AM on 05/27/2019 -----------------------------------------  Patient reports he is feeling better however he still reports a sense of numbness and some weakness in his arms  bilaterally, he reports his legs feel better however when I went to stand him up he is very unsteady.  His wife reports that he has had this numbness/weakness for over a week now.  Now he is complaining of a headache.  I will send him for MRI of the brain and C-spine to evaluate for lesion/abnormality causing bilateral arm numbness/weakness.   I have asked my colleague to follow-up on MRI results ____________________________________________   FINAL CLINICAL IMPRESSION(S) / ED DIAGNOSES  Final diagnoses:  Generalized weakness        Note:  This document was prepared using Dragon voice recognition software and may include unintentional dictation errors.   Lavonia Drafts, MD 05/27/19 [id number]    Lavonia Drafts, MD 05/27/19 [phone]

## 2019-05-27 NOTE — ED Notes (Signed)
Seems drowsy but speech clear and appropriate. Able to use urinal with assist. To MRI with tech.

## 2019-05-27 NOTE — ED Notes (Signed)
emtala reviewed by this RN 

## 2019-05-27 NOTE — ED Notes (Signed)
Returns to room. Very drowsy. Resting with HOB elevated with 3l 02 by nasal canula.

## 2019-05-27 NOTE — ED Notes (Signed)
MRI contacted. Can take patient now.

## 2019-05-27 NOTE — ED Notes (Signed)
Went in to prepare pt for discharge, pt states she feels like needles are in his hands. Pt unable to hold phone or sit himself up in bed. Kinner MD at bedside. Orders for MRI

## 2019-05-27 NOTE — ED Notes (Signed)
Doroteo Bradford- wife: [phone]

## 2019-05-27 NOTE — ED Triage Notes (Addendum)
Patient states that about an hour ago he started feeling numb all over. Patient states numbness in bilateral arms and legs that has caused him to fall twice. Patient falling asleep during triage. Patient states that he has been drinking alcohol tonight.

## 2019-05-27 NOTE — ED Provider Notes (Signed)
-----------------------------------------   8:42 AM on 05/27/2019 -----------------------------------------  Patient care assumed from Dr. Corky Downs.  Patient states he was unable to lie flat for the MRI.  States he feels like he could not breathe in the machine.  I discussed patient's recent symptoms with his wife and the patient symptoms have been ongoing for approximately 1.5 months.  However the wife states they have gotten to the point where he will occasionally have to hold onto a wall while walking, does not feel comfortable holding his 1-month child because he is worried that he will dropper.  I really believe the patient would benefit from MRI imaging.  Patient is agreeable to try again with oxygen therapy and Ativan as an anxiolytic.  Patient was able to successfully tolerate MR imaging of the head and C-spine.  MRI of the brain is largely nonrevealing.  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  Unfortunately we do not have anyone on-call for neurosurgery today.  However given the patient's worsening symptoms now with 2 falls last night we will discussed with [facility] for possible transfer for neurosurgical evaluation.  I discussed the patient with Dr. Antionette Fairy of [facility] who is accepted the patient ED the ED for further evaluation.   Harvest Dark, MD 05/27/19 1229

## 2019-05-27 NOTE — ED Notes (Signed)
Patient found sitting on the bench next to the bed with blood pressure cuff off and wife was lying on the stretcher. Patient was assisted to the stretcher, patient was unsteady on transfer, but follow commands easily. Patient appeared drowsy, but awakened easily.

## 2019-05-27 NOTE — ED Notes (Signed)
pts wife updated at this time

## 2019-05-28 LAB — SARS CORONAVIRUS 2 (TAT 6-24 HRS): SARS Coronavirus 2: NEGATIVE

## 2019-06-28 DIAGNOSIS — Z8619 Personal history of other infectious and parasitic diseases: Secondary | ICD-10-CM

## 2019-06-30 ENCOUNTER — Ambulatory Visit: Payer: Medicaid Other | Admitting: Physician Assistant

## 2019-06-30 ENCOUNTER — Other Ambulatory Visit: Payer: Self-pay

## 2019-06-30 DIAGNOSIS — Z113 Encounter for screening for infections with a predominantly sexual mode of transmission: Secondary | ICD-10-CM

## 2019-06-30 LAB — GRAM STAIN

## 2019-07-01 ENCOUNTER — Encounter: Payer: Self-pay | Admitting: Physician Assistant

## 2019-07-01 NOTE — Progress Notes (Signed)
    STI clinic/screening visit  Subjective:  Keith Liu is a 43 y.o. male being seen today for an STI screening visit. The patient reports they do not have symptoms.  Patient has the following medical conditions:   Patient Active Problem List   Diagnosis Date Noted  . History of syphilis 04/18/2018  . Obesity hypoventilation syndrome ([place]) 03/13/2015  . Cardiomegaly 03/13/2015  . Essential hypertension 11/06/2014  . SOB (shortness of breath) 11/06/2014  . DM type 2 (diabetes mellitus, type 2) ([place]) 09/21/2014  . Hyperlipidemia associated with type 2 diabetes mellitus ([place]) 09/21/2014  . Tobacco dependence 09/21/2014     Chief Complaint  Patient presents with  . SEXUALLY TRANSMITTED DISEASE    HPI  Patient reports that he is not having any symptoms but would like a screening.  States that he is scheduled to have back surgery within the next month and wants to make sure he is "clear" before that.  Declines blood work today as had testing about 2 weeks ago.  See flowsheet for further details and programmatic requirements.    The following portions of the patient's history were reviewed and updated as appropriate: allergies, current medications, past medical history, past social history, past surgical history and problem list.  Objective:  There were no vitals filed for this visit.  Physical Exam Constitutional:      General: He is not in acute distress.    Appearance: Normal appearance.  HENT:     Head: Normocephalic and atraumatic.     Mouth/Throat:     Mouth: Mucous membranes are moist.     Pharynx: Oropharynx is clear. No oropharyngeal exudate or posterior oropharyngeal erythema.  Eyes:     Conjunctiva/sclera: Conjunctivae normal.  Neck:     Musculoskeletal: Neck supple.  Pulmonary:     Effort: Pulmonary effort is normal.  Abdominal:     Palpations: Abdomen is soft. There is no mass.     Tenderness: There is no abdominal tenderness. There is no guarding or  rebound.  Genitourinary:    Penis: Normal.      Scrotum/Testes: Normal.     Comments: Pubic area without nits, lice, edema, erythema, lesions and inguinal adenopathy. Penis circumcised and without discharge at meatus. Lymphadenopathy:     Cervical: No cervical adenopathy.  Skin:    General: Skin is warm and dry.     Findings: No bruising, erythema, lesion or rash.  Neurological:     Mental Status: He is alert and oriented to person, place, and time.  Psychiatric:        Mood and Affect: Mood normal.        Behavior: Behavior normal.        Thought Content: Thought content normal.        Judgment: Judgment normal.       Assessment and Plan:  Keith Liu is a 43 y.o. male presenting to the [facility] Department for STI screening  1. Screening for STD (sexually transmitted disease) Patient is without symptoms today.  Declines blood work today. Rec condoms with all sex Await test results.  Counseled that RN will call if needs to RTC for any treatment once results are back. - Gram stain - Gonococcus culture     No follow-ups on file.  No future appointments.  Jerene Dilling, PA

## 2019-07-05 LAB — GONOCOCCUS CULTURE

## 2020-01-04 ENCOUNTER — Ambulatory Visit: Payer: Medicaid Other

## 2020-01-09 ENCOUNTER — Other Ambulatory Visit: Payer: Self-pay

## 2020-01-09 ENCOUNTER — Ambulatory Visit: Payer: Medicaid Other | Admitting: Family Medicine

## 2020-01-09 DIAGNOSIS — Z113 Encounter for screening for infections with a predominantly sexual mode of transmission: Secondary | ICD-10-CM

## 2020-01-09 DIAGNOSIS — F17200 Nicotine dependence, unspecified, uncomplicated: Secondary | ICD-10-CM

## 2020-01-09 LAB — GRAM STAIN

## 2020-01-09 NOTE — Progress Notes (Signed)
[facility] Department STI clinic/screening visit  Subjective:  Keith Liu is a 44 y.o. male being seen today for an STI screening visit. The patient reports they do not have symptoms.    Patient has the following medical conditions:   Patient Active Problem List   Diagnosis Date Noted  . History of syphilis 04/18/2018  . Obesity hypoventilation syndrome (HCC) 03/13/2015  . Cardiomegaly 03/13/2015  . Essential hypertension 11/06/2014  . SOB (shortness of breath) 11/06/2014  . DM type 2 (diabetes mellitus, type 2) (HCC) 09/21/2014  . Hyperlipidemia associated with type 2 diabetes mellitus (HCC) 09/21/2014  . Tobacco dependence 09/21/2014     Chief Complaint  Patient presents with  . SEXUALLY TRANSMITTED DISEASE    HPI  Presenting for routine screening today. Denies any symptoms. Had unprotected sex recently with a partner. Patient reports last urination 30 minutes    See flowsheet for further details and programmatic requirements.    The following portions of the patient's history were reviewed and updated as appropriate: allergies, current medications, past medical history, past social history, past surgical history and problem list.  Objective:  There were no vitals filed for this visit.  Physical Exam Constitutional:      Appearance: Normal appearance.  HENT:     Head: Normocephalic and atraumatic.     Comments: No nits or hair loss    Mouth/Throat:     Mouth: Mucous membranes are moist.     Pharynx: Oropharynx is clear. No oropharyngeal exudate or posterior oropharyngeal erythema.  Pulmonary:     Effort: Pulmonary effort is normal.  Abdominal:     General: Abdomen is flat.     Palpations: Abdomen is soft. There is no hepatomegaly or mass.     Tenderness: There is no abdominal tenderness.  Genitourinary:    Pubic Area: No rash or pubic lice.      Penis: Normal.      Testes: Normal.     Epididymis:     Right: Normal.     Left: Normal.   Rectum: Normal.  Lymphadenopathy:     Head:     Right side of head: No preauricular or posterior auricular adenopathy.     Left side of head: No preauricular or posterior auricular adenopathy.     Cervical: No cervical adenopathy.     Upper Body:     Right upper body: No supraclavicular or axillary adenopathy.     Left upper body: No supraclavicular or axillary adenopathy.     Lower Body: No right inguinal adenopathy. No left inguinal adenopathy.  Skin:    General: Skin is warm and dry.     Findings: No rash.  Neurological:     Mental Status: He is alert and oriented to person, place, and time.       Assessment and Plan:  Keith Liu is a 44 y.o. male presenting to the [facility] Department for STI screening  1. Screening examination for venereal disease Patient does not have STI symptoms Patient declined blood work screening today- reports recent HIV test within past year- last result in Epic 04/2018. Accepts gram stain only Patient meets criteria for HepB screening? No. Ordered? No - NA Patient meets criteria for HepC screening? Yes. Ordered? No - reports screeing since his incarceration7 years ago that was negative per report. counseled that he could be screened her at [facility] if desired. Declined today Recommended condom use with all sex Discussed importance of condom use for  STI prevent Encouraged to return for rescreening in 3-4 months given recent unprotected sex.   Treat gram stain per standing order Discussed time line for State Lab results and that patient will be called with positive results and encouraged patient to call if he had not heard in 2 weeks Recommended returning for continued or worsening symptoms.    Return if symptoms worsen or fail to improve, for reassurance on negative testing.  No future appointments.  Caren Macadam, MD

## 2020-01-09 NOTE — Progress Notes (Signed)
Here today for STD screening. Declines bloodwork. Jaquon Gingerich, RN ? ?

## 2020-01-09 NOTE — Progress Notes (Signed)
Gram Stain results reviewed. Per standing orders no treatment indicated. Lyrah Bradt, RN ° °

## 2020-01-11 ENCOUNTER — Encounter: Payer: Self-pay | Admitting: Family Medicine

## 2020-01-13 LAB — GONOCOCCUS CULTURE

## 2020-02-20 ENCOUNTER — Ambulatory Visit: Payer: Self-pay

## 2020-02-22 ENCOUNTER — Ambulatory Visit: Payer: Self-pay | Admitting: Physician Assistant

## 2020-02-22 ENCOUNTER — Other Ambulatory Visit: Payer: Self-pay

## 2020-02-22 DIAGNOSIS — Z113 Encounter for screening for infections with a predominantly sexual mode of transmission: Secondary | ICD-10-CM

## 2020-02-22 LAB — GRAM STAIN

## 2020-02-22 NOTE — Progress Notes (Signed)
Gram stain reviewed with provider and is negative today, so no treatment needed for gram stain per standing order and per [name], PA verbal order. Counseled pt per [name], PA verbal order to follow up with his primary doctor and pt states understanding. Provider orders completed.

## 2020-02-23 ENCOUNTER — Encounter: Payer: Self-pay | Admitting: Physician Assistant

## 2020-02-23 NOTE — Progress Notes (Signed)
[facility] Department STI clinic/screening visit  Subjective:  Keith Liu is a 44 y.o. male being seen today for an STI screening visit. The patient reports they do have symptoms.    Patient has the following medical conditions:   Patient Active Problem List   Diagnosis Date Noted  . History of syphilis 04/18/2018  . Obesity hypoventilation syndrome ([place]) 03/13/2015  . Cardiomegaly 03/13/2015  . Essential hypertension 11/06/2014  . SOB (shortness of breath) 11/06/2014  . DM type 2 (diabetes mellitus, type 2) ([place]) 09/21/2014  . Hyperlipidemia associated with type 2 diabetes mellitus ([place]) 09/21/2014  . Tobacco dependence 09/21/2014     Chief Complaint  Patient presents with  . SEXUALLY TRANSMITTED DISEASE    screening    HPI  Patient reports that "it doesn't feel right when I pee".  Also, reports "dark urine".  All symptoms have been present for 3-4 days.  Reports that he has DM and HTN for which he takes medication.  States that he has had back surgery for arthritic changes.  Last HIV test was in 2020.   See flowsheet for further details and programmatic requirements.    The following portions of the patient's history were reviewed and updated as appropriate: allergies, current medications, past medical history, past social history, past surgical history and problem list.  Objective:  There were no vitals filed for this visit.  Physical Exam Constitutional:      General: He is not in acute distress.    Appearance: Normal appearance.  HENT:     Head: Normocephalic and atraumatic.     Comments: No nits, lice, or hair loss. No cervical, supraclavicular or axillary adenopathy.    Mouth/Throat:     Mouth: Mucous membranes are moist.     Pharynx: Oropharynx is clear. No oropharyngeal exudate or posterior oropharyngeal erythema.  Eyes:     Conjunctiva/sclera: Conjunctivae normal.  Pulmonary:     Effort: Pulmonary effort is normal.  Abdominal:      Palpations: Abdomen is soft. There is no mass.     Tenderness: There is no abdominal tenderness. There is no guarding or rebound.  Genitourinary:    Penis: Normal.      Testes: Normal.     Comments: Pubic area without nits, lice, edema, erythema, lesions and inguinal adenopatny. Penis circumcised, without rash, lesion and discharge at meatus. Musculoskeletal:     Cervical back: Neck supple. No tenderness.  Skin:    General: Skin is warm and dry.     Findings: No bruising, erythema, lesion or rash.  Neurological:     Mental Status: He is alert and oriented to person, place, and time.  Psychiatric:        Mood and Affect: Mood normal.        Behavior: Behavior normal.        Thought Content: Thought content normal.        Judgment: Judgment normal.       Assessment and Plan:  Keith Liu is a 44 y.o. male presenting to the [facility] Department for STI screening  1. Screening for STD (sexually transmitted disease) Patient into clinic with symptoms.  Declines blood work today. Rec condoms with all sex. Await test results.  Counseled that RN will call if needs to RTC for treatment once results are back. Rec that patient follow up with PCP for evaluation of symptoms. - Gram stain - Gonococcus culture     Return for 2-3 weeks  for TR's, and PRN.  No future appointments.  Matt Holmes, PA

## 2020-02-27 LAB — GONOCOCCUS CULTURE

## 2020-03-19 ENCOUNTER — Ambulatory Visit: Payer: BC Managed Care – PPO | Attending: Internal Medicine

## 2020-04-17 ENCOUNTER — Other Ambulatory Visit: Payer: Self-pay | Admitting: Neurology

## 2020-04-17 DIAGNOSIS — R531 Weakness: Secondary | ICD-10-CM

## 2020-04-17 DIAGNOSIS — M4802 Spinal stenosis, cervical region: Secondary | ICD-10-CM

## 2020-04-26 ENCOUNTER — Other Ambulatory Visit: Payer: Self-pay

## 2020-04-26 ENCOUNTER — Ambulatory Visit: Payer: BC Managed Care – PPO | Attending: Internal Medicine

## 2020-04-26 DIAGNOSIS — I10 Essential (primary) hypertension: Secondary | ICD-10-CM | POA: Diagnosis not present

## 2020-04-26 DIAGNOSIS — G4730 Sleep apnea, unspecified: Secondary | ICD-10-CM | POA: Insufficient documentation

## 2020-04-26 DIAGNOSIS — R5383 Other fatigue: Secondary | ICD-10-CM | POA: Diagnosis not present

## 2020-04-29 ENCOUNTER — Other Ambulatory Visit: Payer: Self-pay

## 2020-04-29 ENCOUNTER — Other Ambulatory Visit (INDEPENDENT_AMBULATORY_CARE_PROVIDER_SITE_OTHER): Payer: Self-pay | Admitting: Vascular Surgery

## 2020-04-29 DIAGNOSIS — L98499 Non-pressure chronic ulcer of skin of other sites with unspecified severity: Secondary | ICD-10-CM

## 2020-04-30 ENCOUNTER — Encounter (INDEPENDENT_AMBULATORY_CARE_PROVIDER_SITE_OTHER): Payer: Self-pay

## 2020-04-30 ENCOUNTER — Encounter (INDEPENDENT_AMBULATORY_CARE_PROVIDER_SITE_OTHER): Payer: Self-pay | Admitting: Vascular Surgery

## 2020-05-10 ENCOUNTER — Ambulatory Visit: Admission: RE | Admit: 2020-05-10 | Payer: BC Managed Care – PPO | Source: Ambulatory Visit

## 2020-09-20 ENCOUNTER — Ambulatory Visit: Payer: BLUE CROSS/BLUE SHIELD

## 2021-03-25 DIAGNOSIS — R200 Anesthesia of skin: Secondary | ICD-10-CM | POA: Diagnosis not present

## 2021-03-25 DIAGNOSIS — M542 Cervicalgia: Secondary | ICD-10-CM | POA: Diagnosis not present

## 2021-03-25 DIAGNOSIS — M5412 Radiculopathy, cervical region: Secondary | ICD-10-CM | POA: Diagnosis not present

## 2021-07-08 DIAGNOSIS — G4733 Obstructive sleep apnea (adult) (pediatric): Secondary | ICD-10-CM | POA: Diagnosis not present

## 2021-07-28 ENCOUNTER — Other Ambulatory Visit: Payer: Self-pay | Admitting: Neurology

## 2021-07-28 ENCOUNTER — Other Ambulatory Visit: Payer: Self-pay | Admitting: Psychology

## 2021-07-28 DIAGNOSIS — R2689 Other abnormalities of gait and mobility: Secondary | ICD-10-CM

## 2021-08-08 ENCOUNTER — Ambulatory Visit: Admission: RE | Admit: 2021-08-08 | Payer: Medicaid Other | Source: Ambulatory Visit

## 2021-09-09 DIAGNOSIS — E119 Type 2 diabetes mellitus without complications: Secondary | ICD-10-CM | POA: Diagnosis not present

## 2021-09-09 DIAGNOSIS — E669 Obesity, unspecified: Secondary | ICD-10-CM | POA: Diagnosis not present

## 2021-09-09 DIAGNOSIS — G4733 Obstructive sleep apnea (adult) (pediatric): Secondary | ICD-10-CM | POA: Diagnosis not present

## 2021-09-09 DIAGNOSIS — I10 Essential (primary) hypertension: Secondary | ICD-10-CM | POA: Diagnosis not present

## 2021-09-10 DIAGNOSIS — I10 Essential (primary) hypertension: Secondary | ICD-10-CM | POA: Diagnosis not present

## 2021-09-10 DIAGNOSIS — E782 Mixed hyperlipidemia: Secondary | ICD-10-CM | POA: Diagnosis not present

## 2021-09-10 DIAGNOSIS — F17210 Nicotine dependence, cigarettes, uncomplicated: Secondary | ICD-10-CM | POA: Diagnosis not present

## 2021-09-10 DIAGNOSIS — E1165 Type 2 diabetes mellitus with hyperglycemia: Secondary | ICD-10-CM | POA: Diagnosis not present

## 2021-09-30 DIAGNOSIS — M4322 Fusion of spine, cervical region: Secondary | ICD-10-CM | POA: Diagnosis not present

## 2021-09-30 DIAGNOSIS — M47812 Spondylosis without myelopathy or radiculopathy, cervical region: Secondary | ICD-10-CM | POA: Diagnosis not present

## 2021-09-30 DIAGNOSIS — M542 Cervicalgia: Secondary | ICD-10-CM | POA: Diagnosis not present

## 2021-10-09 DIAGNOSIS — R200 Anesthesia of skin: Secondary | ICD-10-CM | POA: Diagnosis not present

## 2021-10-14 DIAGNOSIS — Z794 Long term (current) use of insulin: Secondary | ICD-10-CM | POA: Diagnosis not present

## 2021-10-14 DIAGNOSIS — F17210 Nicotine dependence, cigarettes, uncomplicated: Secondary | ICD-10-CM | POA: Diagnosis not present

## 2021-10-14 DIAGNOSIS — E1165 Type 2 diabetes mellitus with hyperglycemia: Secondary | ICD-10-CM | POA: Diagnosis not present

## 2021-10-14 DIAGNOSIS — E782 Mixed hyperlipidemia: Secondary | ICD-10-CM | POA: Diagnosis not present

## 2021-10-14 DIAGNOSIS — I10 Essential (primary) hypertension: Secondary | ICD-10-CM | POA: Diagnosis not present

## 2021-12-08 DIAGNOSIS — I10 Essential (primary) hypertension: Secondary | ICD-10-CM | POA: Diagnosis not present

## 2021-12-08 DIAGNOSIS — R0602 Shortness of breath: Secondary | ICD-10-CM | POA: Diagnosis not present

## 2021-12-08 DIAGNOSIS — E785 Hyperlipidemia, unspecified: Secondary | ICD-10-CM | POA: Diagnosis not present

## 2021-12-08 DIAGNOSIS — E119 Type 2 diabetes mellitus without complications: Secondary | ICD-10-CM | POA: Diagnosis not present

## 2021-12-08 DIAGNOSIS — J40 Bronchitis, not specified as acute or chronic: Secondary | ICD-10-CM | POA: Diagnosis not present

## 2022-02-09 DIAGNOSIS — J449 Chronic obstructive pulmonary disease, unspecified: Secondary | ICD-10-CM | POA: Diagnosis not present

## 2022-02-09 DIAGNOSIS — J40 Bronchitis, not specified as acute or chronic: Secondary | ICD-10-CM | POA: Diagnosis not present

## 2022-02-12 DIAGNOSIS — Z794 Long term (current) use of insulin: Secondary | ICD-10-CM | POA: Diagnosis not present

## 2022-02-12 DIAGNOSIS — E1165 Type 2 diabetes mellitus with hyperglycemia: Secondary | ICD-10-CM | POA: Diagnosis not present

## 2022-05-11 ENCOUNTER — Other Ambulatory Visit: Payer: Self-pay

## 2022-05-11 ENCOUNTER — Emergency Department
Admission: EM | Admit: 2022-05-11 | Discharge: 2022-05-11 | Disposition: A | Discharge status: Home / Self Care | Payer: Medicaid Other | Attending: Emergency Medicine | Admitting: Emergency Medicine

## 2022-05-11 DIAGNOSIS — M791 Myalgia, unspecified site: Secondary | ICD-10-CM | POA: Diagnosis present

## 2022-05-11 DIAGNOSIS — U071 COVID-19: Secondary | ICD-10-CM | POA: Diagnosis not present

## 2022-05-11 LAB — RESP PANEL BY RT-PCR (FLU A&B, COVID) ARPGX2
Influenza A by PCR: NEGATIVE
Influenza B by PCR: NEGATIVE
SARS Coronavirus 2 by RT PCR: POSITIVE — AB

## 2022-05-11 NOTE — ED Triage Notes (Signed)
Ambulatory to triage with c/o " I just want to make sure I dont have a cold."  Pt states body aches started yesterday.  Denies fever. Denies cough, sore throat, nasal congestion, chest pain, shortness of breath or other related sx. States " its just body aches."

## 2022-05-11 NOTE — ED Provider Notes (Signed)
[facility] Provider Note    Event Date/Time   First MD Initiated Contact with Patient 05/11/22 [phone]     (approximate)   History   URI   HPI  Keith Liu is a 46 y.o. male who presents for evaluation of generalized body aches and some mild shortness of breath.  The symptoms have been present for 3 days.  He says it is not bothering him too much, "I just want to make sure I do not have a cold".  He denies sore throat, headache, neck pain, chest pain, nausea, vomiting, abdominal pain, and diarrhea.     Physical Exam   Triage Vital Signs: ED Triage Vitals  Enc Vitals Group     BP 05/11/22 0502 (!) 157/80     Pulse Rate 05/11/22 0502 92     Resp 05/11/22 0502 (!) 92     Temp 05/11/22 0502 97.9 F (36.6 C)     Temp Source 05/11/22 0502 Oral     SpO2 05/11/22 0502 99 %     Weight 05/11/22 0504 99.8 kg (220 lb)     Height 05/11/22 0504 1.753 m (5\' 9" )     Head Circumference --      Peak Flow --      Pain Score 05/11/22 0503 0     Pain Loc --      Pain Edu? --      Excl. in GC? --     Most recent vital signs: Vitals:   05/11/22 0600 05/11/22 0630  BP: (!) 152/90 (!) 168/121  Pulse: 87 94  Resp:  (!) 22  Temp:  99 F (37.2 C)  SpO2: 95% 98%     General: Awake, no distress.  CV:  Good peripheral perfusion.  Normal heart sounds. Resp:  Normal effort.  Lungs are clear to auscultation bilaterally. Abd:  No distention.  Other:  Mood and affect are normal under the circumstances.   ED Results / Procedures / Treatments   Labs (all labs ordered are listed, but only abnormal results are displayed) Labs Reviewed  RESP PANEL BY RT-PCR (FLU A&B, COVID) ARPGX2 - Abnormal; Notable for the following components:      Result Value   SARS Coronavirus 2 by RT PCR POSITIVE (*)    All other components within normal limits     IMPRESSION / MDM / ASSESSMENT AND PLAN / ED COURSE  I reviewed the triage vital signs and the nursing notes.                               Differential diagnosis includes, but is not limited to, nonspecific viral illness, COVID-19, influenza, community-acquired pneumonia.  Patient's presentation is most consistent with acute illness / injury with system symptoms.  Patient very minimally tachypneic but that was after he walked in.  Vital signs stabilized after arrival.  He is hypertensive but this is apparently baseline.  He has no symptoms to suggest hypertensive emergency nor need for additional evaluation.  Respiratory viral panel was ordered upon the patient's arrival and he is positive for COVID-19.  I updated him about the results and he seemed to accept them without any difficulty.  He said he is unvaccinated and has had COVID before.  I talked with him about paxlovid but he declined treatment.  He will follow-up as an outpatient.  I gave my usual and customary return precautions.  FINAL CLINICAL IMPRESSION(S) / ED DIAGNOSES   Final diagnoses:  COVID-19     Rx / DC Orders   ED Discharge Orders     None        Note:  This document was prepared using Dragon voice recognition software and may include unintentional dictation errors.   Loleta Rose, MD 05/11/22 [phone]

## 2022-05-11 NOTE — Discharge Instructions (Addendum)
As we discussed, although you have tested positive for COVID-19 (coronavirus), you do not need to be hospitalized at this time.  Read through all the included information including the recommendations from the CDC.  We recommend that you self-quarantine at home with your immediate family only (people with whom you have already been in contact) for about a week after your fever has gone away (without taking medication to make your temperature come down, such as Tylenol (acetaminophen)), after your respiratory symptoms have improved.  You should have as minimal contact as possible with anyone else including close family as per the CDC paperwork guidelines listed below. Follow-up with your doctor by phone or online as needed and return immediately to the emergency department or call 911 only if you develop new or worsening symptoms that concern you.  If you were prescribed any medications, please use them as instructed. ° °You can find up-to-date information about COVID-19 in [place] by calling the [place] Coronavirus Helpline: 1-866-462-3821. You may also call 2-1-1, or 888-892-1162, or additional resources.  You can also find information online at https://www.ncdhhs.gov/divisions/public-health/coronavirus-disease-2019-covid-19-response-[place], or on the Center for Disease Control (CDC) website at https://www.cdc.gov/coronavirus/2019-ncov/index.html. °

## 2022-05-11 NOTE — ED Notes (Signed)
Pt brought to room 13, this RN assumed care.

## 2022-05-21 DIAGNOSIS — Z794 Long term (current) use of insulin: Secondary | ICD-10-CM | POA: Diagnosis not present

## 2022-05-21 DIAGNOSIS — E119 Type 2 diabetes mellitus without complications: Secondary | ICD-10-CM | POA: Diagnosis not present

## 2022-05-21 DIAGNOSIS — N529 Male erectile dysfunction, unspecified: Secondary | ICD-10-CM | POA: Diagnosis not present

## 2022-05-21 DIAGNOSIS — J449 Chronic obstructive pulmonary disease, unspecified: Secondary | ICD-10-CM | POA: Diagnosis not present

## 2022-05-21 DIAGNOSIS — I10 Essential (primary) hypertension: Secondary | ICD-10-CM | POA: Diagnosis not present

## 2022-05-21 DIAGNOSIS — G4733 Obstructive sleep apnea (adult) (pediatric): Secondary | ICD-10-CM | POA: Diagnosis not present

## 2022-05-21 DIAGNOSIS — M5416 Radiculopathy, lumbar region: Secondary | ICD-10-CM | POA: Diagnosis not present

## 2022-07-15 DIAGNOSIS — Z20822 Contact with and (suspected) exposure to covid-19: Secondary | ICD-10-CM | POA: Diagnosis not present

## 2022-07-15 DIAGNOSIS — E119 Type 2 diabetes mellitus without complications: Secondary | ICD-10-CM | POA: Diagnosis not present

## 2022-07-15 DIAGNOSIS — Z7982 Long term (current) use of aspirin: Secondary | ICD-10-CM | POA: Diagnosis not present

## 2022-07-15 DIAGNOSIS — R0981 Nasal congestion: Secondary | ICD-10-CM | POA: Diagnosis not present

## 2022-07-15 DIAGNOSIS — B9789 Other viral agents as the cause of diseases classified elsewhere: Secondary | ICD-10-CM | POA: Diagnosis not present

## 2022-07-15 DIAGNOSIS — F17210 Nicotine dependence, cigarettes, uncomplicated: Secondary | ICD-10-CM | POA: Diagnosis not present

## 2022-07-15 DIAGNOSIS — Z79899 Other long term (current) drug therapy: Secondary | ICD-10-CM | POA: Diagnosis not present

## 2022-07-15 DIAGNOSIS — J029 Acute pharyngitis, unspecified: Secondary | ICD-10-CM | POA: Diagnosis not present

## 2022-07-15 DIAGNOSIS — Z7984 Long term (current) use of oral hypoglycemic drugs: Secondary | ICD-10-CM | POA: Diagnosis not present

## 2022-07-15 DIAGNOSIS — E785 Hyperlipidemia, unspecified: Secondary | ICD-10-CM | POA: Diagnosis not present

## 2022-07-15 DIAGNOSIS — J069 Acute upper respiratory infection, unspecified: Secondary | ICD-10-CM | POA: Diagnosis not present

## 2022-07-15 DIAGNOSIS — R059 Cough, unspecified: Secondary | ICD-10-CM | POA: Diagnosis not present

## 2022-07-15 DIAGNOSIS — I10 Essential (primary) hypertension: Secondary | ICD-10-CM | POA: Diagnosis not present

## 2022-07-15 DIAGNOSIS — Z794 Long term (current) use of insulin: Secondary | ICD-10-CM | POA: Diagnosis not present

## 2022-07-21 ENCOUNTER — Emergency Department: Payer: Medicaid Other

## 2022-07-21 ENCOUNTER — Emergency Department
Admission: EM | Admit: 2022-07-21 | Discharge: 2022-07-21 | Disposition: A | Discharge status: Home / Self Care | Payer: Medicaid Other | Attending: Emergency Medicine | Admitting: Emergency Medicine

## 2022-07-21 ENCOUNTER — Encounter: Payer: Self-pay | Admitting: Emergency Medicine

## 2022-07-21 ENCOUNTER — Other Ambulatory Visit: Payer: Self-pay

## 2022-07-21 DIAGNOSIS — E119 Type 2 diabetes mellitus without complications: Secondary | ICD-10-CM | POA: Insufficient documentation

## 2022-07-21 DIAGNOSIS — Z1152 Encounter for screening for COVID-19: Secondary | ICD-10-CM | POA: Insufficient documentation

## 2022-07-21 DIAGNOSIS — D72829 Elevated white blood cell count, unspecified: Secondary | ICD-10-CM | POA: Diagnosis not present

## 2022-07-21 DIAGNOSIS — I10 Essential (primary) hypertension: Secondary | ICD-10-CM | POA: Insufficient documentation

## 2022-07-21 DIAGNOSIS — J189 Pneumonia, unspecified organism: Secondary | ICD-10-CM | POA: Insufficient documentation

## 2022-07-21 DIAGNOSIS — R0602 Shortness of breath: Secondary | ICD-10-CM | POA: Diagnosis not present

## 2022-07-21 DIAGNOSIS — R059 Cough, unspecified: Secondary | ICD-10-CM | POA: Diagnosis present

## 2022-07-21 LAB — COMPREHENSIVE METABOLIC PANEL
ALT: 28 U/L (ref 0–44)
AST: 20 U/L (ref 15–41)
Albumin: 3.8 g/dL (ref 3.5–5.0)
Alkaline Phosphatase: 47 U/L (ref 38–126)
Anion gap: 9 (ref 5–15)
BUN: 13 mg/dL (ref 6–20)
CO2: 28 mmol/L (ref 22–32)
Calcium: 9.3 mg/dL (ref 8.9–10.3)
Chloride: 100 mmol/L (ref 98–111)
Creatinine, Ser: 0.79 mg/dL (ref 0.61–1.24)
GFR, Estimated: >60 mL/min (ref >60)
Glucose, Bld: 177 mg/dL — ABNORMAL HIGH (ref 70–99)
Potassium: 4 mmol/L (ref 3.5–5.1)
Sodium: 137 mmol/L (ref 135–145)
Total Bilirubin: 0.6 mg/dL (ref 0.3–1.2)
Total Protein: 8.2 g/dL — ABNORMAL HIGH (ref 6.5–8.1)

## 2022-07-21 LAB — CBC
HCT: 49.5 % (ref 39.0–52.0)
Hemoglobin: 15.8 g/dL (ref 13.0–17.0)
MCH: 26 pg (ref 26.0–34.0)
MCHC: 31.9 g/dL (ref 30.0–36.0)
MCV: 81.5 fL (ref 80.0–100.0)
Platelets: 289 10*3/uL (ref 150–400)
RBC: 6.07 MIL/uL — ABNORMAL HIGH (ref 4.22–5.81)
RDW: 15.4 % (ref 11.5–15.5)
WBC: 12.1 10*3/uL — ABNORMAL HIGH (ref 4.0–10.5)
nRBC: 0 % (ref 0.0–0.2)

## 2022-07-21 LAB — RESP PANEL BY RT-PCR (FLU A&B, COVID) ARPGX2
Influenza A by PCR: NEGATIVE
Influenza B by PCR: NEGATIVE
SARS Coronavirus 2 by RT PCR: NEGATIVE

## 2022-07-21 MED ORDER — BENZONATATE 100 MG PO CAPS: 100.0000 mg | ORAL_CAPSULE | Freq: Four times a day (QID) | ORAL | 0 refills | Status: AC | PRN

## 2022-07-21 MED ORDER — AZITHROMYCIN 250 MG PO TABS: ORAL_TABLET | ORAL | 0 refills | Status: AC

## 2022-07-21 MED ORDER — IPRATROPIUM-ALBUTEROL 0.5-2.5 (3) MG/3ML IN SOLN
3.0000 mL | Freq: Once | RESPIRATORY_TRACT | Status: AC
  Administered 2022-07-21: 3 mL via RESPIRATORY_TRACT
  Filled 2022-07-21: qty 3

## 2022-07-21 MED ORDER — AMOXICILLIN 500 MG PO CAPS: 1000.0000 mg | ORAL_CAPSULE | Freq: Three times a day (TID) | ORAL | 0 refills | Status: AC

## 2022-07-21 NOTE — ED Triage Notes (Signed)
Pt here with a cough x2 weeks. Pt also having SOB. Pt states he went to his primary and was told that he had sinus issues but did not have anything respiratory. Pt denies pain.

## 2022-07-21 NOTE — ED Provider Notes (Signed)
[facility] Provider Note    Event Date/Time   First MD Initiated Contact with Patient 07/21/22 1004     (approximate)   History   Cough   HPI  Keith Liu is a 46 y.o. male with a history of diabetes and hypertension who reports he has had a cough for 2 weeks.  He reports that cough has become more severe over the last several days.  Was seen in outside emergency department and was told that he has a viral illness.  Does not think that he has had fever.  No chest pain.     Physical Exam   Triage Vital Signs: ED Triage Vitals [07/21/22 0938]  Enc Vitals Group     BP (!) 172/103     Pulse Rate (!) 102     Resp 18     Temp 97.7 F (36.5 C)     Temp Source Oral     SpO2 95 %     Weight 99.8 kg (220 lb 0.3 oz)     Height 1.753 m (5\' 9" )     Head Circumference      Peak Flow      Pain Score 0     Pain Loc      Pain Edu?      Excl. in GC?     Most recent vital signs: Vitals:   07/21/22 0938  BP: (!) 172/103  Pulse: (!) 102  Resp: 18  Temp: 97.7 F (36.5 C)  SpO2: 95%     General: Awake, no distress.  CV:  Good peripheral perfusion.  Resp:  Normal effort.  Bibasilar Rales Abd:  No distention.  Other:     ED Results / Procedures / Treatments   Labs (all labs ordered are listed, but only abnormal results are displayed) Labs Reviewed  CBC - Abnormal; Notable for the following components:      Result Value   WBC 12.1 (*)    RBC 6.07 (*)    All other components within normal limits  COMPREHENSIVE METABOLIC PANEL - Abnormal; Notable for the following components:   Glucose, Bld 177 (*)    Total Protein 8.2 (*)    All other components within normal limits  RESP PANEL BY RT-PCR (FLU A&B, COVID) ARPGX2     EKG  ED ECG REPORT I, 07/23/22, the attending physician, personally viewed and interpreted this ECG.  Date: 07/21/2022  Rhythm: normal sinus rhythm QRS Axis: normal Intervals: normal ST/T Wave abnormalities:  Nonspecific changes Narrative Interpretation: no evidence of acute ischemia    RADIOLOGY Chest x-ray viewed interpreted by me, lower lobe pneumonia    PROCEDURES:  Critical Care performed:   Procedures   MEDICATIONS ORDERED IN ED: Medications  ipratropium-albuterol (DUONEB) 0.5-2.5 (3) MG/3ML nebulizer solution 3 mL (3 mLs Nebulization Given 07/21/22 1018)     IMPRESSION / MDM / ASSESSMENT AND PLAN / ED COURSE  I reviewed the triage vital signs and the nursing notes. Patient's presentation is most consistent with acute illness / injury with system symptoms.  Patient presents with cough and mild shortness of breath.  He is afebrile mild tachycardia here.  White blood cell count is elevated at 12.1  Differential includes pneumonia, viral URI/bronchitis  Chest x-ray is consistent with pneumonia given elevated white blood cell count and negative COVID, influenza will treat with antibiotics  Considered admission however patient is appropriate for discharge.  Strict return precautions discussed with patient  FINAL CLINICAL IMPRESSION(S) / ED DIAGNOSES   Final diagnoses:  Community acquired pneumonia, unspecified laterality     Rx / DC Orders   ED Discharge Orders          Ordered    amoxicillin (AMOXIL) 500 MG capsule  3 times daily        07/21/22 1016    azithromycin (ZITHROMAX Z-PAK) 250 MG tablet        07/21/22 1016    benzonatate (TESSALON PERLES) 100 MG capsule  Every 6 hours PRN        07/21/22 1028             Note:  This document was prepared using Dragon voice recognition software and may include unintentional dictation errors.   Jene Every, MD 07/21/22 1215

## 2022-07-23 DIAGNOSIS — J189 Pneumonia, unspecified organism: Secondary | ICD-10-CM | POA: Diagnosis not present

## 2022-07-23 DIAGNOSIS — F17200 Nicotine dependence, unspecified, uncomplicated: Secondary | ICD-10-CM | POA: Diagnosis not present

## 2022-07-23 DIAGNOSIS — G4733 Obstructive sleep apnea (adult) (pediatric): Secondary | ICD-10-CM | POA: Diagnosis not present

## 2022-07-23 DIAGNOSIS — I10 Essential (primary) hypertension: Secondary | ICD-10-CM | POA: Diagnosis not present

## 2022-08-04 DIAGNOSIS — E669 Obesity, unspecified: Secondary | ICD-10-CM | POA: Diagnosis not present

## 2022-08-04 DIAGNOSIS — I10 Essential (primary) hypertension: Secondary | ICD-10-CM | POA: Diagnosis not present

## 2022-08-04 DIAGNOSIS — E1169 Type 2 diabetes mellitus with other specified complication: Secondary | ICD-10-CM | POA: Diagnosis not present

## 2022-08-04 DIAGNOSIS — F17210 Nicotine dependence, cigarettes, uncomplicated: Secondary | ICD-10-CM | POA: Diagnosis not present

## 2022-08-04 DIAGNOSIS — E782 Mixed hyperlipidemia: Secondary | ICD-10-CM | POA: Diagnosis not present

## 2022-08-06 DIAGNOSIS — I10 Essential (primary) hypertension: Secondary | ICD-10-CM | POA: Diagnosis not present

## 2022-08-06 DIAGNOSIS — J449 Chronic obstructive pulmonary disease, unspecified: Secondary | ICD-10-CM | POA: Diagnosis not present

## 2022-08-06 DIAGNOSIS — N529 Male erectile dysfunction, unspecified: Secondary | ICD-10-CM | POA: Diagnosis not present

## 2022-08-06 DIAGNOSIS — J189 Pneumonia, unspecified organism: Secondary | ICD-10-CM | POA: Diagnosis not present

## 2022-08-06 DIAGNOSIS — E119 Type 2 diabetes mellitus without complications: Secondary | ICD-10-CM | POA: Diagnosis not present

## 2022-08-06 DIAGNOSIS — Z794 Long term (current) use of insulin: Secondary | ICD-10-CM | POA: Diagnosis not present

## 2022-08-19 ENCOUNTER — Encounter (HOSPITAL_BASED_OUTPATIENT_CLINIC_OR_DEPARTMENT_OTHER): Payer: Self-pay

## 2022-08-19 DIAGNOSIS — G4733 Obstructive sleep apnea (adult) (pediatric): Secondary | ICD-10-CM

## 2022-08-25 MED ORDER — PROPOFOL 10 MG/ML IV BOLUS
INTRAVENOUS | Status: AC
  Filled 2022-08-25: qty 40

## 2022-08-25 MED ORDER — FENTANYL CITRATE (PF) 100 MCG/2ML IJ SOLN
INTRAMUSCULAR | Status: AC
  Filled 2022-08-25: qty 2

## 2022-08-26 ENCOUNTER — Ambulatory Visit: Payer: Medicaid Other | Attending: Otolaryngology

## 2022-08-26 DIAGNOSIS — E119 Type 2 diabetes mellitus without complications: Secondary | ICD-10-CM | POA: Insufficient documentation

## 2022-08-26 DIAGNOSIS — Z7984 Long term (current) use of oral hypoglycemic drugs: Secondary | ICD-10-CM | POA: Insufficient documentation

## 2022-08-26 DIAGNOSIS — Z7982 Long term (current) use of aspirin: Secondary | ICD-10-CM | POA: Diagnosis not present

## 2022-08-26 DIAGNOSIS — I10 Essential (primary) hypertension: Secondary | ICD-10-CM | POA: Diagnosis not present

## 2022-08-26 DIAGNOSIS — Z79899 Other long term (current) drug therapy: Secondary | ICD-10-CM | POA: Insufficient documentation

## 2022-08-26 DIAGNOSIS — G4733 Obstructive sleep apnea (adult) (pediatric): Secondary | ICD-10-CM | POA: Diagnosis not present

## 2022-08-26 DIAGNOSIS — J449 Chronic obstructive pulmonary disease, unspecified: Secondary | ICD-10-CM | POA: Diagnosis not present

## 2022-08-26 DIAGNOSIS — F17200 Nicotine dependence, unspecified, uncomplicated: Secondary | ICD-10-CM | POA: Insufficient documentation

## 2022-08-26 DIAGNOSIS — G4761 Periodic limb movement disorder: Secondary | ICD-10-CM | POA: Diagnosis not present

## 2022-08-26 DIAGNOSIS — R0683 Snoring: Secondary | ICD-10-CM | POA: Insufficient documentation

## 2022-08-26 DIAGNOSIS — M5416 Radiculopathy, lumbar region: Secondary | ICD-10-CM | POA: Insufficient documentation

## 2022-08-26 DIAGNOSIS — E785 Hyperlipidemia, unspecified: Secondary | ICD-10-CM | POA: Diagnosis not present

## 2022-08-26 DIAGNOSIS — M4710 Other spondylosis with myelopathy, site unspecified: Secondary | ICD-10-CM | POA: Diagnosis not present

## 2022-08-26 DIAGNOSIS — Z8616 Personal history of COVID-19: Secondary | ICD-10-CM | POA: Diagnosis not present

## 2022-08-26 DIAGNOSIS — Z7951 Long term (current) use of inhaled steroids: Secondary | ICD-10-CM | POA: Insufficient documentation

## 2022-08-28 DIAGNOSIS — Z114 Encounter for screening for human immunodeficiency virus [HIV]: Secondary | ICD-10-CM | POA: Diagnosis not present

## 2022-08-28 DIAGNOSIS — Z113 Encounter for screening for infections with a predominantly sexual mode of transmission: Secondary | ICD-10-CM | POA: Diagnosis not present

## 2022-08-28 DIAGNOSIS — Z202 Contact with and (suspected) exposure to infections with a predominantly sexual mode of transmission: Secondary | ICD-10-CM | POA: Diagnosis not present

## 2022-09-10 ENCOUNTER — Ambulatory Visit: Payer: Medicaid Other

## 2022-10-01 DIAGNOSIS — G4733 Obstructive sleep apnea (adult) (pediatric): Secondary | ICD-10-CM | POA: Diagnosis not present

## 2022-10-09 DIAGNOSIS — M549 Dorsalgia, unspecified: Secondary | ICD-10-CM | POA: Diagnosis not present

## 2022-10-09 DIAGNOSIS — Z981 Arthrodesis status: Secondary | ICD-10-CM | POA: Diagnosis not present

## 2022-10-12 ENCOUNTER — Other Ambulatory Visit: Payer: Self-pay | Admitting: Physician Assistant

## 2022-10-12 DIAGNOSIS — M4802 Spinal stenosis, cervical region: Secondary | ICD-10-CM

## 2022-10-12 DIAGNOSIS — M549 Dorsalgia, unspecified: Secondary | ICD-10-CM

## 2022-10-16 DIAGNOSIS — E1169 Type 2 diabetes mellitus with other specified complication: Secondary | ICD-10-CM | POA: Diagnosis not present

## 2022-10-16 DIAGNOSIS — N529 Male erectile dysfunction, unspecified: Secondary | ICD-10-CM | POA: Diagnosis not present

## 2022-10-16 DIAGNOSIS — E119 Type 2 diabetes mellitus without complications: Secondary | ICD-10-CM | POA: Diagnosis not present

## 2022-10-16 DIAGNOSIS — M4722 Other spondylosis with radiculopathy, cervical region: Secondary | ICD-10-CM | POA: Diagnosis not present

## 2022-10-16 DIAGNOSIS — M62838 Other muscle spasm: Secondary | ICD-10-CM | POA: Diagnosis not present

## 2022-10-16 DIAGNOSIS — G4733 Obstructive sleep apnea (adult) (pediatric): Secondary | ICD-10-CM | POA: Diagnosis not present

## 2022-10-16 DIAGNOSIS — I10 Essential (primary) hypertension: Secondary | ICD-10-CM | POA: Diagnosis not present

## 2022-10-16 DIAGNOSIS — E669 Obesity, unspecified: Secondary | ICD-10-CM | POA: Diagnosis not present

## 2022-10-16 DIAGNOSIS — Z794 Long term (current) use of insulin: Secondary | ICD-10-CM | POA: Diagnosis not present

## 2022-10-16 DIAGNOSIS — M4712 Other spondylosis with myelopathy, cervical region: Secondary | ICD-10-CM | POA: Diagnosis not present

## 2022-10-16 DIAGNOSIS — J449 Chronic obstructive pulmonary disease, unspecified: Secondary | ICD-10-CM | POA: Diagnosis not present

## 2022-10-19 ENCOUNTER — Ambulatory Visit: Payer: Medicaid Other

## 2022-10-22 DIAGNOSIS — G4733 Obstructive sleep apnea (adult) (pediatric): Secondary | ICD-10-CM | POA: Diagnosis not present

## 2022-10-28 ENCOUNTER — Ambulatory Visit
Admission: RE | Admit: 2022-10-28 | Discharge: 2022-10-28 | Disposition: A | Discharge status: Home / Self Care | Payer: Medicaid Other | Source: Ambulatory Visit | Attending: Physician Assistant | Admitting: Physician Assistant

## 2022-10-28 DIAGNOSIS — M542 Cervicalgia: Secondary | ICD-10-CM | POA: Diagnosis not present

## 2022-10-28 DIAGNOSIS — M549 Dorsalgia, unspecified: Secondary | ICD-10-CM | POA: Diagnosis not present

## 2022-10-28 DIAGNOSIS — M4802 Spinal stenosis, cervical region: Secondary | ICD-10-CM | POA: Diagnosis not present

## 2022-10-28 DIAGNOSIS — R200 Anesthesia of skin: Secondary | ICD-10-CM | POA: Diagnosis not present

## 2022-11-01 DIAGNOSIS — G4733 Obstructive sleep apnea (adult) (pediatric): Secondary | ICD-10-CM | POA: Diagnosis not present

## 2022-11-20 DIAGNOSIS — G4733 Obstructive sleep apnea (adult) (pediatric): Secondary | ICD-10-CM | POA: Diagnosis not present

## 2022-11-30 DIAGNOSIS — G4733 Obstructive sleep apnea (adult) (pediatric): Secondary | ICD-10-CM | POA: Diagnosis not present

## 2022-12-03 DIAGNOSIS — F17200 Nicotine dependence, unspecified, uncomplicated: Secondary | ICD-10-CM | POA: Diagnosis not present

## 2022-12-03 DIAGNOSIS — E669 Obesity, unspecified: Secondary | ICD-10-CM | POA: Diagnosis not present

## 2022-12-03 DIAGNOSIS — E1169 Type 2 diabetes mellitus with other specified complication: Secondary | ICD-10-CM | POA: Diagnosis not present

## 2022-12-03 DIAGNOSIS — I10 Essential (primary) hypertension: Secondary | ICD-10-CM | POA: Diagnosis not present

## 2022-12-03 DIAGNOSIS — E782 Mixed hyperlipidemia: Secondary | ICD-10-CM | POA: Diagnosis not present

## 2022-12-03 DIAGNOSIS — S90821A Blister (nonthermal), right foot, initial encounter: Secondary | ICD-10-CM | POA: Diagnosis not present

## 2022-12-21 DIAGNOSIS — G4733 Obstructive sleep apnea (adult) (pediatric): Secondary | ICD-10-CM | POA: Diagnosis not present

## 2022-12-31 DIAGNOSIS — G4733 Obstructive sleep apnea (adult) (pediatric): Secondary | ICD-10-CM | POA: Diagnosis not present

## 2023-01-06 DIAGNOSIS — M542 Cervicalgia: Secondary | ICD-10-CM | POA: Diagnosis not present

## 2023-01-06 DIAGNOSIS — Z981 Arthrodesis status: Secondary | ICD-10-CM | POA: Diagnosis not present

## 2023-01-06 DIAGNOSIS — R519 Headache, unspecified: Secondary | ICD-10-CM | POA: Diagnosis not present

## 2023-01-14 ENCOUNTER — Ambulatory Visit: Payer: Medicaid Other | Admitting: Family Medicine

## 2023-01-27 DIAGNOSIS — G629 Polyneuropathy, unspecified: Secondary | ICD-10-CM | POA: Diagnosis not present

## 2023-01-27 DIAGNOSIS — R200 Anesthesia of skin: Secondary | ICD-10-CM | POA: Diagnosis not present

## 2023-02-08 DIAGNOSIS — S90821A Blister (nonthermal), right foot, initial encounter: Secondary | ICD-10-CM | POA: Diagnosis not present

## 2023-02-08 DIAGNOSIS — S90822A Blister (nonthermal), left foot, initial encounter: Secondary | ICD-10-CM | POA: Diagnosis not present

## 2023-02-08 DIAGNOSIS — R21 Rash and other nonspecific skin eruption: Secondary | ICD-10-CM | POA: Diagnosis not present

## 2023-02-08 DIAGNOSIS — N529 Male erectile dysfunction, unspecified: Secondary | ICD-10-CM | POA: Diagnosis not present

## 2023-02-25 DIAGNOSIS — F17200 Nicotine dependence, unspecified, uncomplicated: Secondary | ICD-10-CM | POA: Diagnosis not present

## 2023-02-25 DIAGNOSIS — E11621 Type 2 diabetes mellitus with foot ulcer: Secondary | ICD-10-CM | POA: Diagnosis not present

## 2023-02-25 DIAGNOSIS — I10 Essential (primary) hypertension: Secondary | ICD-10-CM | POA: Diagnosis not present

## 2023-02-25 DIAGNOSIS — E782 Mixed hyperlipidemia: Secondary | ICD-10-CM | POA: Diagnosis not present

## 2023-02-25 DIAGNOSIS — L97509 Non-pressure chronic ulcer of other part of unspecified foot with unspecified severity: Secondary | ICD-10-CM | POA: Diagnosis not present

## 2023-04-01 DIAGNOSIS — G4733 Obstructive sleep apnea (adult) (pediatric): Secondary | ICD-10-CM | POA: Diagnosis not present

## 2023-04-09 ENCOUNTER — Ambulatory Visit: Payer: Medicaid Other

## 2023-04-16 DIAGNOSIS — I10 Essential (primary) hypertension: Secondary | ICD-10-CM | POA: Diagnosis not present

## 2023-04-16 DIAGNOSIS — E669 Obesity, unspecified: Secondary | ICD-10-CM | POA: Diagnosis not present

## 2023-04-16 DIAGNOSIS — Z794 Long term (current) use of insulin: Secondary | ICD-10-CM | POA: Diagnosis not present

## 2023-04-16 DIAGNOSIS — M4712 Other spondylosis with myelopathy, cervical region: Secondary | ICD-10-CM | POA: Diagnosis not present

## 2023-04-16 DIAGNOSIS — M4722 Other spondylosis with radiculopathy, cervical region: Secondary | ICD-10-CM | POA: Diagnosis not present

## 2023-04-16 DIAGNOSIS — E119 Type 2 diabetes mellitus without complications: Secondary | ICD-10-CM | POA: Diagnosis not present

## 2023-04-16 DIAGNOSIS — N529 Male erectile dysfunction, unspecified: Secondary | ICD-10-CM | POA: Diagnosis not present

## 2023-04-16 DIAGNOSIS — G4733 Obstructive sleep apnea (adult) (pediatric): Secondary | ICD-10-CM | POA: Diagnosis not present

## 2023-04-16 DIAGNOSIS — J449 Chronic obstructive pulmonary disease, unspecified: Secondary | ICD-10-CM | POA: Diagnosis not present

## 2023-04-19 ENCOUNTER — Ambulatory Visit: Payer: Medicaid Other | Admitting: Family Medicine

## 2023-04-19 ENCOUNTER — Encounter: Payer: Self-pay | Admitting: Family Medicine

## 2023-04-19 DIAGNOSIS — Z113 Encounter for screening for infections with a predominantly sexual mode of transmission: Secondary | ICD-10-CM

## 2023-04-19 NOTE — Progress Notes (Signed)
[facility] Department STI clinic/screening visit  Subjective:  Keith Liu is a 47 y.o. male being seen today for an STI screening visit. The patient reports they do not have symptoms.    Patient has the following medical conditions:   Patient Active Problem List   Diagnosis Date Noted   History of syphilis 04/18/2018   Obesity hypoventilation syndrome (HCC) 03/13/2015   Cardiomegaly 03/13/2015   Essential hypertension 11/06/2014   SOB (shortness of breath) 11/06/2014   DM type 2 (diabetes mellitus, type 2) (HCC) 09/21/2014   Hyperlipidemia associated with type 2 diabetes mellitus (HCC) 09/21/2014   Tobacco dependence 09/21/2014     Chief Complaint  Patient presents with   SEXUALLY TRANSMITTED DISEASE    STI screening-no symptoms    HPI  Patient reports to clinic for STI testing  Last HIV test per patient/review of record was  Lab Results  Component Value Date   HMHIVSCREEN Negative - Validated 04/13/2018   No results found for: "HIV"  Does the patient or their partner desires a pregnancy in the next year? No  Screening for MPX risk: Does the patient have an unexplained rash? No Is the patient MSM? No Does the patient endorse multiple sex partners or anonymous sex partners? No Did the patient have close or sexual contact with a person diagnosed with MPX? No Has the patient traveled outside the [place] where MPX is endemic? No Is there a high clinical suspicion for MPX-- evidenced by one of the following No  -Unlikely to be chickenpox  -Lymphadenopathy  -Rash that present in same phase of evolution on any given body part   See flowsheet for further details and programmatic requirements.    There is no immunization history on file for this patient.   The following portions of the patient's history were reviewed and updated as appropriate: allergies, current medications, past medical history, past social history, past surgical history and problem  list.  Objective:  There were no vitals filed for this visit.  Physical Exam Vitals and nursing note reviewed.  Constitutional:      Appearance: Normal appearance.  HENT:     Head: Normocephalic and atraumatic.     Mouth/Throat:     Mouth: Mucous membranes are moist.     Pharynx: No oropharyngeal exudate or posterior oropharyngeal erythema.  Eyes:     General:        Right eye: No discharge.        Left eye: No discharge.     Conjunctiva/sclera:     Right eye: Right conjunctiva is not injected. No exudate.    Left eye: Left conjunctiva is not injected. No exudate. Pulmonary:     Effort: Pulmonary effort is normal.  Abdominal:     General: Abdomen is flat.     Palpations: Abdomen is soft. There is no hepatomegaly or mass.     Tenderness: There is no abdominal tenderness. There is no rebound.  Genitourinary:    Comments: Declined genital exam- asymptomatic Lymphadenopathy:     Cervical: No cervical adenopathy.     Upper Body:     Right upper body: No supraclavicular or axillary adenopathy.     Left upper body: No supraclavicular or axillary adenopathy.  Skin:    General: Skin is warm and dry.  Neurological:     Mental Status: He is alert and oriented to person, place, and time.       Assessment and Plan:  Keith Liu is a  47 y.o. male presenting to the [facility] Department for STI screening  1. Screening for venereal disease Declined blood work and pharyngeal swab today - Chlamydia/GC NAA, Confirmation   Patient does not have STI symptoms Patient accepted screenings including  urine GC/Chlamydia Patient meets criteria for HepB screening? No. Ordered? not applicable Patient meets criteria for HepC screening? No. Ordered? not applicable Recommended condom use with all sex Discussed importance of condom use for STI prevent  Treat positive test results per standing order. Discussed time line for State Lab results and that patient will be called  with positive results and encouraged patient to call if he had not heard in 2 weeks Recommended repeat testing in 3 months with positive results. Recommended returning for continued or worsening symptoms.   Return if symptoms worsen or fail to improve, for STI screening.  No future appointments. Total time spent 15 minutes  Lenice Llamas, [place]

## 2023-04-19 NOTE — Progress Notes (Signed)
Pt here for STI screening.  Denies symptoms.  Declines STI blood work.  No in house labs performed today.  Condoms declined.-Collins Scotland, RN

## 2023-05-02 DIAGNOSIS — G4733 Obstructive sleep apnea (adult) (pediatric): Secondary | ICD-10-CM | POA: Diagnosis not present

## 2023-05-26 DIAGNOSIS — G4733 Obstructive sleep apnea (adult) (pediatric): Secondary | ICD-10-CM | POA: Diagnosis not present

## 2023-06-02 DIAGNOSIS — G4733 Obstructive sleep apnea (adult) (pediatric): Secondary | ICD-10-CM | POA: Diagnosis not present

## 2023-06-30 DIAGNOSIS — E11621 Type 2 diabetes mellitus with foot ulcer: Secondary | ICD-10-CM | POA: Diagnosis not present

## 2023-06-30 DIAGNOSIS — E1165 Type 2 diabetes mellitus with hyperglycemia: Secondary | ICD-10-CM | POA: Diagnosis not present

## 2023-06-30 DIAGNOSIS — F17200 Nicotine dependence, unspecified, uncomplicated: Secondary | ICD-10-CM | POA: Diagnosis not present

## 2023-06-30 DIAGNOSIS — L97509 Non-pressure chronic ulcer of other part of unspecified foot with unspecified severity: Secondary | ICD-10-CM | POA: Diagnosis not present

## 2023-06-30 DIAGNOSIS — I10 Essential (primary) hypertension: Secondary | ICD-10-CM | POA: Diagnosis not present

## 2023-06-30 DIAGNOSIS — E782 Mixed hyperlipidemia: Secondary | ICD-10-CM | POA: Diagnosis not present

## 2023-08-13 DIAGNOSIS — E119 Type 2 diabetes mellitus without complications: Secondary | ICD-10-CM | POA: Diagnosis not present

## 2023-08-13 DIAGNOSIS — B353 Tinea pedis: Secondary | ICD-10-CM | POA: Diagnosis not present

## 2023-08-13 DIAGNOSIS — L851 Acquired keratosis [keratoderma] palmaris et plantaris: Secondary | ICD-10-CM | POA: Diagnosis not present

## 2023-08-13 DIAGNOSIS — E1165 Type 2 diabetes mellitus with hyperglycemia: Secondary | ICD-10-CM | POA: Diagnosis not present

## 2023-08-13 DIAGNOSIS — B351 Tinea unguium: Secondary | ICD-10-CM | POA: Diagnosis not present

## 2023-08-13 DIAGNOSIS — Z87891 Personal history of nicotine dependence: Secondary | ICD-10-CM | POA: Diagnosis not present

## 2023-08-13 DIAGNOSIS — L818 Other specified disorders of pigmentation: Secondary | ICD-10-CM | POA: Diagnosis not present

## 2023-10-20 DIAGNOSIS — Z1211 Encounter for screening for malignant neoplasm of colon: Secondary | ICD-10-CM | POA: Diagnosis not present

## 2023-10-20 DIAGNOSIS — Z794 Long term (current) use of insulin: Secondary | ICD-10-CM | POA: Diagnosis not present

## 2023-10-20 DIAGNOSIS — I10 Essential (primary) hypertension: Secondary | ICD-10-CM | POA: Diagnosis not present

## 2023-10-20 DIAGNOSIS — J449 Chronic obstructive pulmonary disease, unspecified: Secondary | ICD-10-CM | POA: Diagnosis not present

## 2023-10-20 DIAGNOSIS — Z0000 Encounter for general adult medical examination without abnormal findings: Secondary | ICD-10-CM | POA: Diagnosis not present

## 2023-10-20 DIAGNOSIS — E119 Type 2 diabetes mellitus without complications: Secondary | ICD-10-CM | POA: Diagnosis not present

## 2023-10-20 DIAGNOSIS — N529 Male erectile dysfunction, unspecified: Secondary | ICD-10-CM | POA: Diagnosis not present

## 2023-10-20 DIAGNOSIS — E66812 Obesity, class 2: Secondary | ICD-10-CM | POA: Diagnosis not present

## 2023-10-20 DIAGNOSIS — M542 Cervicalgia: Secondary | ICD-10-CM | POA: Diagnosis not present

## 2023-10-20 DIAGNOSIS — B353 Tinea pedis: Secondary | ICD-10-CM | POA: Diagnosis not present

## 2023-10-20 DIAGNOSIS — Z125 Encounter for screening for malignant neoplasm of prostate: Secondary | ICD-10-CM | POA: Diagnosis not present

## 2023-10-20 DIAGNOSIS — G4733 Obstructive sleep apnea (adult) (pediatric): Secondary | ICD-10-CM | POA: Diagnosis not present

## 2023-12-03 ENCOUNTER — Ambulatory Visit: Payer: Medicaid Other | Admitting: Urology

## 2023-12-16 DIAGNOSIS — E11621 Type 2 diabetes mellitus with foot ulcer: Secondary | ICD-10-CM | POA: Diagnosis not present

## 2023-12-16 DIAGNOSIS — I10 Essential (primary) hypertension: Secondary | ICD-10-CM | POA: Diagnosis not present

## 2023-12-16 DIAGNOSIS — E782 Mixed hyperlipidemia: Secondary | ICD-10-CM | POA: Diagnosis not present

## 2023-12-16 DIAGNOSIS — B353 Tinea pedis: Secondary | ICD-10-CM | POA: Diagnosis not present

## 2023-12-16 DIAGNOSIS — L97509 Non-pressure chronic ulcer of other part of unspecified foot with unspecified severity: Secondary | ICD-10-CM | POA: Diagnosis not present

## 2023-12-16 DIAGNOSIS — E1165 Type 2 diabetes mellitus with hyperglycemia: Secondary | ICD-10-CM | POA: Diagnosis not present

## 2023-12-21 ENCOUNTER — Emergency Department
Admission: EM | Admit: 2023-12-21 | Discharge: 2023-12-21 | Disposition: A | Discharge status: Home / Self Care | Attending: Emergency Medicine | Admitting: Emergency Medicine

## 2023-12-21 ENCOUNTER — Other Ambulatory Visit: Payer: Self-pay

## 2023-12-21 ENCOUNTER — Emergency Department

## 2023-12-21 DIAGNOSIS — L03115 Cellulitis of right lower limb: Secondary | ICD-10-CM | POA: Diagnosis not present

## 2023-12-21 DIAGNOSIS — I10 Essential (primary) hypertension: Secondary | ICD-10-CM | POA: Insufficient documentation

## 2023-12-21 DIAGNOSIS — L989 Disorder of the skin and subcutaneous tissue, unspecified: Secondary | ICD-10-CM | POA: Diagnosis not present

## 2023-12-21 DIAGNOSIS — X58XXXA Exposure to other specified factors, initial encounter: Secondary | ICD-10-CM | POA: Diagnosis not present

## 2023-12-21 DIAGNOSIS — M79671 Pain in right foot: Secondary | ICD-10-CM

## 2023-12-21 DIAGNOSIS — S99921A Unspecified injury of right foot, initial encounter: Secondary | ICD-10-CM | POA: Diagnosis present

## 2023-12-21 DIAGNOSIS — E1142 Type 2 diabetes mellitus with diabetic polyneuropathy: Secondary | ICD-10-CM | POA: Diagnosis not present

## 2023-12-21 DIAGNOSIS — E119 Type 2 diabetes mellitus without complications: Secondary | ICD-10-CM | POA: Insufficient documentation

## 2023-12-21 DIAGNOSIS — B353 Tinea pedis: Secondary | ICD-10-CM | POA: Diagnosis not present

## 2023-12-21 DIAGNOSIS — Z87891 Personal history of nicotine dependence: Secondary | ICD-10-CM | POA: Diagnosis not present

## 2023-12-21 DIAGNOSIS — S90821A Blister (nonthermal), right foot, initial encounter: Secondary | ICD-10-CM | POA: Insufficient documentation

## 2023-12-21 DIAGNOSIS — L089 Local infection of the skin and subcutaneous tissue, unspecified: Secondary | ICD-10-CM | POA: Insufficient documentation

## 2023-12-21 DIAGNOSIS — D485 Neoplasm of uncertain behavior of skin: Secondary | ICD-10-CM | POA: Diagnosis not present

## 2023-12-21 LAB — BASIC METABOLIC PANEL WITH GFR
Anion gap: 7 (ref 5–15)
BUN: 22 mg/dL — ABNORMAL HIGH (ref 6–20)
CO2: 26 mmol/L (ref 22–32)
Calcium: 8.7 mg/dL — ABNORMAL LOW (ref 8.9–10.3)
Chloride: 104 mmol/L (ref 98–111)
Creatinine, Ser: 1.16 mg/dL (ref 0.61–1.24)
GFR, Estimated: >60 mL/min (ref >60)
Glucose, Bld: 138 mg/dL — ABNORMAL HIGH (ref 70–99)
Potassium: 3.3 mmol/L — ABNORMAL LOW (ref 3.5–5.1)
Sodium: 137 mmol/L (ref 135–145)

## 2023-12-21 LAB — CBC WITH DIFFERENTIAL/PLATELET
Abs Immature Granulocytes: 0.05 10*3/uL (ref 0.00–0.07)
Basophils Absolute: 0.1 10*3/uL (ref 0.0–0.1)
Basophils Relative: 1 %
Eosinophils Absolute: 0.4 10*3/uL (ref 0.0–0.5)
Eosinophils Relative: 3 %
HCT: 49.6 % (ref 39.0–52.0)
Hemoglobin: 16.3 g/dL (ref 13.0–17.0)
Immature Granulocytes: 0 %
Lymphocytes Relative: 24 %
Lymphs Abs: 3.1 10*3/uL (ref 0.7–4.0)
MCH: 26.5 pg (ref 26.0–34.0)
MCHC: 32.9 g/dL (ref 30.0–36.0)
MCV: 80.8 fL (ref 80.0–100.0)
Monocytes Absolute: 1.1 10*3/uL — ABNORMAL HIGH (ref 0.1–1.0)
Monocytes Relative: 8 %
Neutro Abs: 8.5 10*3/uL — ABNORMAL HIGH (ref 1.7–7.7)
Neutrophils Relative %: 64 %
Platelets: 202 10*3/uL (ref 150–400)
RBC: 6.14 MIL/uL — ABNORMAL HIGH (ref 4.22–5.81)
RDW: 15 % (ref 11.5–15.5)
Smear Review: NORMAL
WBC: 13.3 10*3/uL — ABNORMAL HIGH (ref 4.0–10.5)
nRBC: 0 % (ref 0.0–0.2)

## 2023-12-21 MED ORDER — CEPHALEXIN 500 MG PO CAPS
500.0000 mg | ORAL_CAPSULE | Freq: Once | ORAL | Status: AC
  Administered 2023-12-21: 500 mg via ORAL
  Filled 2023-12-21: qty 1

## 2023-12-21 MED ORDER — CEPHALEXIN 500 MG PO CAPS
500.0000 mg | ORAL_CAPSULE | Freq: Four times a day (QID) | ORAL | 0 refills | Status: AC
Start: 2023-12-21 — End: 2023-12-28

## 2023-12-21 NOTE — Discharge Instructions (Signed)
 Take antibiotic as prescribed follow-up with your foot doctor.  Thank you for choosing us  for your health care today!  Please see your primary doctor this week for a follow up appointment.   If you have any new, worsening, or unexpected symptoms call your doctor right away or come back to the emergency department for reevaluation.  It was my pleasure to care for you today.   Arron Large Margery Sheets, MD

## 2023-12-21 NOTE — ED Provider Notes (Signed)
 [facility] Provider Note    Event Date/Time   First MD Initiated Contact with Patient 12/21/23 0244     (approximate)   History   Foot Ulcer   HPI  Keith Liu is a 48 y.o. male   Past medical history of diabetes and hypertension presents emergency department with pain around his heel where he has a blister that has been longstanding but now is causing pain.  There is no redness or skin changes surrounding.  No systemic symptoms like fevers or chills.  No purulence.  No injury to the area.  Able to walk despite the pain.  Met with his doctor last week and the opted to just observe it.  He feels the pain is gotten worse.  He has a podiatrist.   External Medical Documents Reviewed: Office visit from last week with endocrinology in the [facility]      Physical Exam   Triage Vital Signs: ED Triage Vitals  Encounter Vitals Group     BP 12/21/23 0012 (!) 156/93     Systolic BP Percentile --      Diastolic BP Percentile --      Pulse Rate 12/21/23 0012 (!) 107     Resp 12/21/23 0258 18     Temp 12/21/23 0012 97.9 F (36.6 C)     Temp Source 12/21/23 0012 Oral     SpO2 12/21/23 0012 96 %     Weight 12/21/23 0012 212 lb (96.2 kg)     Height 12/21/23 0012 5\' 4"  (1.626 m)     Head Circumference --      Peak Flow --      Pain Score 12/21/23 0012 6     Pain Loc --      Pain Education --      Exclude from Growth Chart --     Most recent vital signs: Vitals:   12/21/23 0012 12/21/23 0258  BP: (!) 156/93 (!) 145/98  Pulse: (!) 107 89  Resp:  18  Temp: 97.9 F (36.6 C) 98.1 F (36.7 C)  SpO2: 96% 97%    General: Awake, no distress.  CV:  Good peripheral perfusion.  Resp:  Normal effort.  Abd:  No distention.  Other:  Awake alert comfortable appearing in no acute distress slightly hypertensive otherwise vital signs normal.  Nontoxic appearance.  He has some blisters to the sole of his foot and 1 blister to the heel which is  slightly tender to palpation no obvious skin changes fluctuance and neurovascular intact to the affected extremity.   ED Results / Procedures / Treatments   Labs (all labs ordered are listed, but only abnormal results are displayed) Labs Reviewed  CBC WITH DIFFERENTIAL/PLATELET - Abnormal; Notable for the following components:      Result Value   WBC 13.3 (*)    RBC 6.14 (*)    Neutro Abs 8.5 (*)    Monocytes Absolute 1.1 (*)    All other components within normal limits  BASIC METABOLIC PANEL WITH GFR - Abnormal; Notable for the following components:   Potassium 3.3 (*)    Glucose, Bld 138 (*)    BUN 22 (*)    Calcium 8.7 (*)    All other components within normal limits     I ordered and reviewed the above labs they are notable for white blood cell count slightly elevated 13.3  PROCEDURES:  Critical Care performed: No  Procedures   MEDICATIONS ORDERED  IN ED: Medications  cephALEXin (KEFLEX) capsule 500 mg (500 mg Oral Given 12/21/23 0330)    IMPRESSION / MDM / ASSESSMENT AND PLAN / ED COURSE  I reviewed the triage vital signs and the nursing notes.                                Patient's presentation is most consistent with acute presentation with potential threat to life or bodily function.  Differential diagnosis includes, but is not limited to, cellulitis, abscess, diabetic foot ulcer, considered but less likely ischemic limb, DVT, joint infection, osteomyelitis   The patient is on the cardiac monitor to evaluate for evidence of arrhythmia and/or significant heart rate changes.  MDM:    He has had some longstanding blisters 1 of which is painful now in this patient with diabetes high risk for infection and an elevated white blood cell count, so despite no overt signs of infection on clinical exam and this well-appearing patient I think it prudent to cover him for cellulitic coverage with Keflex.  He will follow-up with his podiatrist.        FINAL  CLINICAL IMPRESSION(S) / ED DIAGNOSES   Final diagnoses:  Foot pain, right  Foot infection     Rx / DC Orders   ED Discharge Orders          Ordered    cephALEXin (KEFLEX) 500 MG capsule  4 times daily        12/21/23 0316             Note:  This document was prepared using Dragon voice recognition software and may include unintentional dictation errors.    Buell Carmin, MD 12/21/23 [phone]

## 2023-12-21 NOTE — ED Triage Notes (Addendum)
 Pt c/o R foot swelling x 1 week. Pt was seen by his dr 5 days ago and was told it was fine. Hx of T2D. R foot appears read and swollen. Pt took 600 mg of ibuprofen PTA

## 2024-01-04 DIAGNOSIS — Z87891 Personal history of nicotine dependence: Secondary | ICD-10-CM | POA: Diagnosis not present

## 2024-01-04 DIAGNOSIS — S90821D Blister (nonthermal), right foot, subsequent encounter: Secondary | ICD-10-CM | POA: Diagnosis not present

## 2024-01-04 DIAGNOSIS — B353 Tinea pedis: Secondary | ICD-10-CM | POA: Diagnosis not present

## 2024-01-04 DIAGNOSIS — E1142 Type 2 diabetes mellitus with diabetic polyneuropathy: Secondary | ICD-10-CM | POA: Diagnosis not present

## 2024-01-17 ENCOUNTER — Ambulatory Visit: Admitting: Urology

## 2024-02-01 DIAGNOSIS — Z01818 Encounter for other preprocedural examination: Secondary | ICD-10-CM | POA: Diagnosis not present

## 2024-02-01 DIAGNOSIS — Z1211 Encounter for screening for malignant neoplasm of colon: Secondary | ICD-10-CM | POA: Diagnosis not present

## 2024-02-25 ENCOUNTER — Encounter: Payer: Self-pay | Admitting: Internal Medicine

## 2024-03-15 ENCOUNTER — Encounter: Admission: RE | Payer: Self-pay | Source: Home / Self Care

## 2024-03-15 ENCOUNTER — Ambulatory Visit: Admission: RE | Admit: 2024-03-15 | Source: Home / Self Care | Admitting: Internal Medicine

## 2024-03-15 HISTORY — DX: Sleep apnea, unspecified: G47.30

## 2024-03-15 HISTORY — DX: Other spondylosis with myelopathy, cervical region: M47.12

## 2024-03-15 HISTORY — DX: Chronic obstructive pulmonary disease, unspecified: J44.9

## 2024-03-15 SURGERY — COLONOSCOPY
Anesthesia: General

## 2024-03-30 ENCOUNTER — Encounter: Payer: Self-pay | Admitting: Internal Medicine

## 2024-04-12 ENCOUNTER — Ambulatory Visit: Admission: RE | Admit: 2024-04-12 | Source: Home / Self Care | Admitting: Internal Medicine

## 2024-04-12 ENCOUNTER — Encounter: Payer: Self-pay | Admitting: General Practice

## 2024-04-12 SURGERY — COLONOSCOPY
Anesthesia: General

## 2024-04-12 NOTE — H&P (Signed)
 Outpatient short stay form Pre-procedure 04/12/2024 9:20 AM Raylyn Speckman K. Aundria, M.D.  Primary Physician: Selinda Quan, PA-C  Reason for visit:  Colon cancer screening  History of present illness:  Patient presents for colonoscopy for colon cancer screening. The patient denies complaints of abdominal pain, significant change in bowel habits, or rectal bleeding.     No current facility-administered medications for this encounter.  Current Outpatient Medications:    albuterol  (VENTOLIN  HFA) 108 (90 Base) MCG/ACT inhaler, Inhale 2 puffs into the lungs every 6 (six) hours as needed for wheezing or shortness of breath. MUST MAKE APPT FOR FURTHER REFILLS, Disp: 18 g, Rfl: 0   amLODipine  (NORVASC ) 10 MG tablet, Take 1 tablet (10 mg total) by mouth daily., Disp: 90 tablet, Rfl: 4   aspirin  (ASPIRIN  CHILDRENS) 81 MG chewable tablet, Chew 1 tablet (81 mg total) by mouth daily., Disp: 90 tablet, Rfl: 3   atorvastatin  (LIPITOR) 10 MG tablet, Take 1 tablet (10 mg total) by mouth daily., Disp: 90 tablet, Rfl: 3   Blood Glucose Monitoring Suppl (TRUE METRIX METER) w/Device KIT, USE AS INSTRUCTED, Disp: 1 kit, Rfl: 0   glipiZIDE  (GLUCOTROL ) 10 MG tablet, Take 1 tablet (10 mg total) by mouth 2 (two) times daily before a meal. Must have office visit for refills, Disp: 180 tablet, Rfl: 3   glucose blood test strip, Use as instructed, Disp: 100 each, Rfl: 12   hydrochlorothiazide  (HYDRODIURIL ) 25 MG tablet, Take 1 tablet (25 mg total) by mouth daily., Disp: 90 tablet, Rfl: 3   Insulin  Glargine (LANTUS  SOLOSTAR) 100 UNIT/ML Solostar Pen, Inject 25 Units into the skin daily at 10 pm., Disp: 12 pen, Rfl: PRN   Insulin  Pen Needle (B-D UF III MINI PEN NEEDLES) 31G X 5 MM MISC, Use as instructed, Disp: 90 each, Rfl: 1   ipratropium-albuterol  (DUONEB) 0.5-2.5 (3) MG/3ML SOLN, Take 3 mLs by nebulization every 6 (six) hours as needed (sob/doe)., Disp: 360 mL, Rfl: 1   losartan  (COZAAR ) 25 MG tablet, Take 1 tablet (25 mg  total) by mouth daily., Disp: 30 tablet, Rfl: 5   metFORMIN  (GLUCOPHAGE ) 1000 MG tablet, Take 1 tablet (1,000 mg total) by mouth 2 (two) times daily with a meal., Disp: 60 tablet, Rfl: 3   Semaglutide,0.25 or 0.5MG /DOS, (OZEMPIC, 0.25 OR 0.5 MG/DOSE,) 2 MG/1.5ML SOPN, Inject into the skin., Disp: , Rfl:    TRUEPLUS LANCETS 26G MISC, 1 each by Does not apply route every 8 (eight) hours as needed., Disp: 100 each, Rfl: 12  No medications prior to admission.     Allergies  Allergen Reactions   Lisinopril  Swelling    Angioedema of lower lip     Past Medical History:  Diagnosis Date   Cervical spondylosis with myelopathy    COPD (chronic obstructive pulmonary disease) (HCC)    Diabetes mellitus without complication (HCC)    Hypertension    Sleep apnea     Review of systems:  Otherwise negative.    Physical Exam  Gen: Alert, oriented. Appears stated age.  HEENT: [place]/AT. PERRLA. Lungs: CTA, no wheezes. CV: RR nl S1, S2. Abd: soft, benign, no masses. BS+ Ext: No edema. Pulses 2+    Planned procedures: Proceed with colonoscopy. The patient understands the nature of the planned procedure, indications, risks, alternatives and potential complications including but not limited to bleeding, infection, perforation, damage to internal organs and possible oversedation/side effects from anesthesia. The patient agrees and gives consent to proceed.  Please refer to procedure notes for findings,  recommendations and patient disposition/instructions.     Edye Hainline K. Aundria, M.D. Gastroenterology 04/12/2024  9:20 AM

## 2024-04-21 DIAGNOSIS — E66812 Obesity, class 2: Secondary | ICD-10-CM | POA: Diagnosis not present

## 2024-04-21 DIAGNOSIS — R21 Rash and other nonspecific skin eruption: Secondary | ICD-10-CM | POA: Diagnosis not present

## 2024-04-21 DIAGNOSIS — F17200 Nicotine dependence, unspecified, uncomplicated: Secondary | ICD-10-CM | POA: Diagnosis not present

## 2024-04-21 DIAGNOSIS — E119 Type 2 diabetes mellitus without complications: Secondary | ICD-10-CM | POA: Diagnosis not present

## 2024-04-21 DIAGNOSIS — I10 Essential (primary) hypertension: Secondary | ICD-10-CM | POA: Diagnosis not present

## 2024-04-21 DIAGNOSIS — Z794 Long term (current) use of insulin: Secondary | ICD-10-CM | POA: Diagnosis not present

## 2024-04-21 DIAGNOSIS — J449 Chronic obstructive pulmonary disease, unspecified: Secondary | ICD-10-CM | POA: Diagnosis not present

## 2024-04-21 DIAGNOSIS — G4733 Obstructive sleep apnea (adult) (pediatric): Secondary | ICD-10-CM | POA: Diagnosis not present

## 2024-04-25 DIAGNOSIS — E1165 Type 2 diabetes mellitus with hyperglycemia: Secondary | ICD-10-CM | POA: Diagnosis not present

## 2024-04-25 DIAGNOSIS — F17200 Nicotine dependence, unspecified, uncomplicated: Secondary | ICD-10-CM | POA: Diagnosis not present

## 2024-04-25 DIAGNOSIS — E782 Mixed hyperlipidemia: Secondary | ICD-10-CM | POA: Diagnosis not present

## 2024-04-25 DIAGNOSIS — I10 Essential (primary) hypertension: Secondary | ICD-10-CM | POA: Diagnosis not present

## 2024-05-17 ENCOUNTER — Encounter: Payer: Self-pay | Admitting: Certified Registered"

## 2024-05-17 MED ORDER — PROPOFOL 1000 MG/100ML IV EMUL
INTRAVENOUS | Status: AC
  Filled 2024-05-17: qty 100

## 2024-06-08 DIAGNOSIS — Z2821 Immunization not carried out because of patient refusal: Secondary | ICD-10-CM | POA: Diagnosis not present

## 2024-06-08 DIAGNOSIS — R21 Rash and other nonspecific skin eruption: Secondary | ICD-10-CM | POA: Diagnosis not present

## 2024-06-08 DIAGNOSIS — L309 Dermatitis, unspecified: Secondary | ICD-10-CM | POA: Diagnosis not present

## 2024-06-08 DIAGNOSIS — K5900 Constipation, unspecified: Secondary | ICD-10-CM | POA: Diagnosis not present

## 2024-06-08 DIAGNOSIS — S90821A Blister (nonthermal), right foot, initial encounter: Secondary | ICD-10-CM | POA: Diagnosis not present

## 2024-06-16 DIAGNOSIS — L851 Acquired keratosis [keratoderma] palmaris et plantaris: Secondary | ICD-10-CM | POA: Diagnosis not present

## 2024-06-16 DIAGNOSIS — S90821D Blister (nonthermal), right foot, subsequent encounter: Secondary | ICD-10-CM | POA: Diagnosis not present

## 2024-06-16 DIAGNOSIS — B351 Tinea unguium: Secondary | ICD-10-CM | POA: Diagnosis not present

## 2024-06-16 DIAGNOSIS — L84 Corns and callosities: Secondary | ICD-10-CM | POA: Diagnosis not present

## 2024-06-16 DIAGNOSIS — E1142 Type 2 diabetes mellitus with diabetic polyneuropathy: Secondary | ICD-10-CM | POA: Diagnosis not present

## 2024-07-11 ENCOUNTER — Encounter: Payer: Self-pay | Admitting: Internal Medicine

## 2024-07-26 ENCOUNTER — Encounter: Admission: RE | Payer: Self-pay | Source: Home / Self Care

## 2024-07-26 ENCOUNTER — Ambulatory Visit: Admission: RE | Admit: 2024-07-26 | Source: Home / Self Care | Admitting: Internal Medicine

## 2024-07-26 SURGERY — COLONOSCOPY
Anesthesia: General

## 2024-07-26 NOTE — H&P (Signed)
 Outpatient short stay form Pre-procedure 07/26/2024 9:26 AM Joseph Bias K. Aundria, M.D.  Primary Physician: [facility], Tamra Leventhal, M.D.  Reason for visit:  Colon cancer screening  History of present illness:  Patient presents for colonoscopy for colon cancer screening. The patient denies complaints of abdominal pain, significant change in bowel habits, or rectal bleeding.     No current facility-administered medications for this encounter.  Current Outpatient Medications:    albuterol  (VENTOLIN  HFA) 108 (90 Base) MCG/ACT inhaler, Inhale 2 puffs into the lungs every 6 (six) hours as needed for wheezing or shortness of breath. MUST MAKE APPT FOR FURTHER REFILLS, Disp: 18 g, Rfl: 0   amLODipine  (NORVASC ) 10 MG tablet, Take 1 tablet (10 mg total) by mouth daily., Disp: 90 tablet, Rfl: 4   aspirin  (ASPIRIN  CHILDRENS) 81 MG chewable tablet, Chew 1 tablet (81 mg total) by mouth daily., Disp: 90 tablet, Rfl: 3   atorvastatin  (LIPITOR) 10 MG tablet, Take 1 tablet (10 mg total) by mouth daily., Disp: 90 tablet, Rfl: 3   Blood Glucose Monitoring Suppl (TRUE METRIX METER) w/Device KIT, USE AS INSTRUCTED, Disp: 1 kit, Rfl: 0   glipiZIDE  (GLUCOTROL ) 10 MG tablet, Take 1 tablet (10 mg total) by mouth 2 (two) times daily before a meal. Must have office visit for refills, Disp: 180 tablet, Rfl: 3   glucose blood test strip, Use as instructed, Disp: 100 each, Rfl: 12   hydrochlorothiazide  (HYDRODIURIL ) 25 MG tablet, Take 1 tablet (25 mg total) by mouth daily., Disp: 90 tablet, Rfl: 3   Insulin  Glargine (LANTUS  SOLOSTAR) 100 UNIT/ML Solostar Pen, Inject 25 Units into the skin daily at 10 pm., Disp: 12 pen, Rfl: PRN   Insulin  Pen Needle (B-D UF III MINI PEN NEEDLES) 31G X 5 MM MISC, Use as instructed, Disp: 90 each, Rfl: 1   ipratropium-albuterol  (DUONEB) 0.5-2.5 (3) MG/3ML SOLN, Take 3 mLs by nebulization every 6 (six) hours as needed (sob/doe)., Disp: 360 mL, Rfl: 1   losartan  (COZAAR ) 25 MG  tablet, Take 1 tablet (25 mg total) by mouth daily., Disp: 30 tablet, Rfl: 5   metFORMIN  (GLUCOPHAGE ) 1000 MG tablet, Take 1 tablet (1,000 mg total) by mouth 2 (two) times daily with a meal., Disp: 60 tablet, Rfl: 3   Semaglutide,0.25 or 0.5MG /DOS, (OZEMPIC, 0.25 OR 0.5 MG/DOSE,) 2 MG/1.5ML SOPN, Inject into the skin., Disp: , Rfl:    TRUEPLUS LANCETS 26G MISC, 1 each by Does not apply route every 8 (eight) hours as needed., Disp: 100 each, Rfl: 12  No medications prior to admission.     Allergies  Allergen Reactions   Lisinopril  Swelling    Angioedema of lower lip     Past Medical History:  Diagnosis Date   Cervical spondylosis with myelopathy    COPD (chronic obstructive pulmonary disease) (HCC)    Diabetes mellitus without complication (HCC)    Hypertension    Sleep apnea     Review of systems:  Otherwise negative.    Physical Exam  Gen: Alert, oriented. Appears stated age.  HEENT: [place]/AT. PERRLA. Lungs: CTA, no wheezes. CV: RR nl S1, S2. Abd: soft, benign, no masses. BS+ Ext: No edema. Pulses 2+    Planned procedures: Proceed with colonoscopy. The patient understands the nature of the planned procedure, indications, risks, alternatives and potential complications including but not limited to bleeding, infection, perforation, damage to internal organs and possible oversedation/side effects from anesthesia. The patient agrees and gives consent to proceed.  Please refer to procedure  notes for findings, recommendations and patient disposition/instructions.     Avalee Castrellon K. Aundria, M.D. Gastroenterology 07/26/2024  9:26 AM
